# Patient Record
Sex: Male | Born: 1955 | Hispanic: Refuse to answer | Smoking: Never smoker
Health system: Southern US, Community
[De-identification: ages and names within clinical notes are randomized; demographics above are authoritative.]

## PROBLEM LIST (undated history)

## (undated) DIAGNOSIS — Z992 Dependence on renal dialysis: Secondary | ICD-10-CM

## (undated) DIAGNOSIS — I509 Heart failure, unspecified: Secondary | ICD-10-CM

## (undated) DIAGNOSIS — N186 End stage renal disease: Secondary | ICD-10-CM

## (undated) HISTORY — PX: INSERTION OF DIALYSIS CATHETER: SHX1324

## (undated) HISTORY — PX: CARDIAC ELECTROPHYSIOLOGY MAPPING AND ABLATION: SHX1292

---

## 1998-10-23 ENCOUNTER — Emergency Department (HOSPITAL_COMMUNITY): Admission: EM | Admit: 1998-10-23 | Discharge: 1998-10-23 | Payer: Self-pay

## 2002-01-06 ENCOUNTER — Encounter: Admission: RE | Admit: 2002-01-06 | Discharge: 2002-01-06 | Payer: Self-pay | Admitting: Ophthalmology

## 2002-01-06 ENCOUNTER — Encounter: Payer: Self-pay | Admitting: Ophthalmology

## 2002-01-19 ENCOUNTER — Encounter: Admission: RE | Admit: 2002-01-19 | Discharge: 2002-04-19 | Payer: Self-pay | Admitting: Family Medicine

## 2003-02-03 ENCOUNTER — Encounter: Payer: Self-pay | Admitting: Emergency Medicine

## 2003-02-03 ENCOUNTER — Inpatient Hospital Stay (HOSPITAL_COMMUNITY): Admission: EM | Admit: 2003-02-03 | Discharge: 2003-02-04 | Payer: Self-pay | Admitting: Emergency Medicine

## 2003-02-07 ENCOUNTER — Encounter: Payer: Self-pay | Admitting: *Deleted

## 2003-02-07 ENCOUNTER — Ambulatory Visit (HOSPITAL_COMMUNITY): Admission: RE | Admit: 2003-02-07 | Discharge: 2003-02-07 | Payer: Self-pay | Admitting: *Deleted

## 2003-02-20 ENCOUNTER — Encounter: Payer: Self-pay | Admitting: Family Medicine

## 2003-02-20 ENCOUNTER — Ambulatory Visit (HOSPITAL_COMMUNITY): Admission: RE | Admit: 2003-02-20 | Discharge: 2003-02-20 | Payer: Self-pay | Admitting: Family Medicine

## 2003-04-10 ENCOUNTER — Ambulatory Visit (HOSPITAL_COMMUNITY): Admission: RE | Admit: 2003-04-10 | Discharge: 2003-04-10 | Payer: Self-pay | Admitting: Pulmonary Disease

## 2003-04-10 ENCOUNTER — Encounter: Payer: Self-pay | Admitting: Pulmonary Disease

## 2003-08-16 ENCOUNTER — Encounter: Admission: RE | Admit: 2003-08-16 | Discharge: 2003-08-16 | Payer: Self-pay | Admitting: Occupational Medicine

## 2003-08-16 ENCOUNTER — Encounter: Payer: Self-pay | Admitting: Occupational Medicine

## 2004-02-25 ENCOUNTER — Inpatient Hospital Stay (HOSPITAL_COMMUNITY): Admission: AD | Admit: 2004-02-25 | Discharge: 2004-02-28 | Payer: Self-pay | Admitting: Nephrology

## 2004-02-25 ENCOUNTER — Encounter: Payer: Self-pay | Admitting: Emergency Medicine

## 2005-05-08 ENCOUNTER — Ambulatory Visit (HOSPITAL_COMMUNITY): Admission: RE | Admit: 2005-05-08 | Discharge: 2005-05-08 | Payer: Self-pay | Admitting: *Deleted

## 2005-05-12 ENCOUNTER — Ambulatory Visit: Payer: Self-pay | Admitting: Critical Care Medicine

## 2006-02-03 ENCOUNTER — Ambulatory Visit: Payer: Self-pay | Admitting: Critical Care Medicine

## 2006-03-10 ENCOUNTER — Ambulatory Visit: Payer: Self-pay | Admitting: Critical Care Medicine

## 2006-09-07 ENCOUNTER — Ambulatory Visit: Payer: Self-pay | Admitting: Pulmonary Disease

## 2006-11-02 ENCOUNTER — Ambulatory Visit: Payer: Self-pay | Admitting: Critical Care Medicine

## 2006-12-11 ENCOUNTER — Ambulatory Visit: Payer: Self-pay | Admitting: Critical Care Medicine

## 2007-04-26 ENCOUNTER — Ambulatory Visit: Payer: Self-pay | Admitting: Critical Care Medicine

## 2007-04-27 ENCOUNTER — Ambulatory Visit (HOSPITAL_BASED_OUTPATIENT_CLINIC_OR_DEPARTMENT_OTHER): Admission: RE | Admit: 2007-04-27 | Discharge: 2007-04-27 | Payer: Self-pay | Admitting: Critical Care Medicine

## 2007-05-06 ENCOUNTER — Ambulatory Visit: Payer: Self-pay | Admitting: Pulmonary Disease

## 2007-05-26 ENCOUNTER — Ambulatory Visit: Payer: Self-pay | Admitting: Pulmonary Disease

## 2007-05-28 ENCOUNTER — Encounter: Payer: Self-pay | Admitting: Critical Care Medicine

## 2007-05-28 ENCOUNTER — Ambulatory Visit (HOSPITAL_BASED_OUTPATIENT_CLINIC_OR_DEPARTMENT_OTHER): Admission: RE | Admit: 2007-05-28 | Discharge: 2007-05-28 | Payer: Self-pay | Admitting: Pulmonary Disease

## 2007-06-07 ENCOUNTER — Ambulatory Visit: Payer: Self-pay | Admitting: Pulmonary Disease

## 2007-07-05 ENCOUNTER — Ambulatory Visit: Payer: Self-pay | Admitting: Pulmonary Disease

## 2007-08-30 DIAGNOSIS — J45909 Unspecified asthma, uncomplicated: Secondary | ICD-10-CM | POA: Insufficient documentation

## 2007-08-30 DIAGNOSIS — E785 Hyperlipidemia, unspecified: Secondary | ICD-10-CM

## 2007-08-30 DIAGNOSIS — E119 Type 2 diabetes mellitus without complications: Secondary | ICD-10-CM

## 2007-08-30 DIAGNOSIS — I509 Heart failure, unspecified: Secondary | ICD-10-CM | POA: Insufficient documentation

## 2007-08-30 DIAGNOSIS — I10 Essential (primary) hypertension: Secondary | ICD-10-CM | POA: Insufficient documentation

## 2007-08-30 DIAGNOSIS — J449 Chronic obstructive pulmonary disease, unspecified: Secondary | ICD-10-CM

## 2007-08-30 DIAGNOSIS — J309 Allergic rhinitis, unspecified: Secondary | ICD-10-CM | POA: Insufficient documentation

## 2007-12-08 ENCOUNTER — Ambulatory Visit: Payer: Self-pay | Admitting: Pulmonary Disease

## 2007-12-09 ENCOUNTER — Telehealth (INDEPENDENT_AMBULATORY_CARE_PROVIDER_SITE_OTHER): Payer: Self-pay | Admitting: *Deleted

## 2008-01-11 ENCOUNTER — Ambulatory Visit: Payer: Self-pay | Admitting: Critical Care Medicine

## 2008-01-11 DIAGNOSIS — G4730 Sleep apnea, unspecified: Secondary | ICD-10-CM | POA: Insufficient documentation

## 2008-10-18 ENCOUNTER — Ambulatory Visit: Payer: Self-pay | Admitting: Critical Care Medicine

## 2008-10-18 DIAGNOSIS — J018 Other acute sinusitis: Secondary | ICD-10-CM

## 2008-10-18 DIAGNOSIS — J209 Acute bronchitis, unspecified: Secondary | ICD-10-CM

## 2008-11-22 ENCOUNTER — Ambulatory Visit: Payer: Self-pay | Admitting: Critical Care Medicine

## 2009-06-17 ENCOUNTER — Emergency Department (HOSPITAL_COMMUNITY): Admission: EM | Admit: 2009-06-17 | Discharge: 2009-06-18 | Payer: Self-pay | Admitting: Emergency Medicine

## 2011-02-01 LAB — BRAIN NATRIURETIC PEPTIDE: Pro B Natriuretic peptide (BNP): 427 pg/mL — ABNORMAL HIGH (ref 0.0–100.0)

## 2011-02-01 LAB — DIFFERENTIAL
Basophils Absolute: 0.1 10*3/uL (ref 0.0–0.1)
Eosinophils Relative: 2 % (ref 0–5)
Lymphocytes Relative: 12 % (ref 12–46)
Lymphs Abs: 1.4 10*3/uL (ref 0.7–4.0)
Neutro Abs: 9.4 10*3/uL — ABNORMAL HIGH (ref 1.7–7.7)

## 2011-02-01 LAB — POCT I-STAT, CHEM 8
BUN: 86 mg/dL — ABNORMAL HIGH (ref 6–23)
Chloride: 113 mEq/L — ABNORMAL HIGH (ref 96–112)
Creatinine, Ser: 3.5 mg/dL — ABNORMAL HIGH (ref 0.4–1.5)
Potassium: 4.9 mEq/L (ref 3.5–5.1)
Sodium: 142 mEq/L (ref 135–145)
TCO2: 20 mmol/L (ref 0–100)

## 2011-02-01 LAB — URINALYSIS, ROUTINE W REFLEX MICROSCOPIC
Bilirubin Urine: NEGATIVE
Hgb urine dipstick: NEGATIVE
Ketones, ur: NEGATIVE mg/dL
Nitrite: NEGATIVE
Urobilinogen, UA: 0.2 mg/dL (ref 0.0–1.0)
pH: 5.5 (ref 5.0–8.0)

## 2011-02-01 LAB — POCT CARDIAC MARKERS
CKMB, poc: 4.8 ng/mL (ref 1.0–8.0)
Myoglobin, poc: 406 ng/mL (ref 12–200)
Troponin i, poc: <0.05 ng/mL (ref 0.00–0.09)

## 2011-02-01 LAB — URINE MICROSCOPIC-ADD ON

## 2011-02-01 LAB — CBC
HCT: 30 % — ABNORMAL LOW (ref 39.0–52.0)
Platelets: 217 10*3/uL (ref 150–400)
RDW: 14.7 % (ref 11.5–15.5)
WBC: 12.1 10*3/uL — ABNORMAL HIGH (ref 4.0–10.5)

## 2011-03-11 NOTE — Procedures (Signed)
Craig Wilcox, Craig Wilcox                 ACCOUNT NO.:  [id number]   MEDICAL RECORD NO.:  [id number]          PATIENT TYPE:  OUT   LOCATION:  SLEEP CENTER                 FACILITY:  [facility]   PHYSICIAN:  Barbaraann Share, MD,FCCPDATE OF BIRTH:  13-Sep-1956   DATE OF STUDY:  04/27/2007                            NOCTURNAL POLYSOMNOGRAM   REFERRING PHYSICIAN:  Charlcie Cradle. Delford Field, MD, FCCP   INDICATION FOR STUDY:  Hypersomnia with sleep apnea.   EPWORTH SCORE:  8.   SLEEP ARCHITECTURE:  The patient had a total sleep time of 281 minutes  with significantly decreased slow wave sleep as well as REM.  Sleep  onset latency was normal, and REM onset was normal as well.  Sleep  efficiency was very poor at 71%.   RESPIRATORY DATA:  The patient was found to have 112 hypopneas and 37  obstructive apneas for an apnea/hypopnea index of 32 events per hour.  The events were clearly worse in the supine position, and there was  moderate to loud snoring noted throughout.   OXYGEN DATA:  There was O2 desaturation as low as 84% with the patient's  obstructive events.   CARDIAC DATA:  No clinically significant cardiac arrhythmias were noted.   MOVEMENT/PARASOMNIA:  The patient was found to have 29 leg jerks with 3  per hour, resulting in arousal or awakening.  This does not appear to be  clinically significant.   IMPRESSION/RECOMMENDATIONS:  Moderate obstructive sleep apnea/hypopnea  syndrome with an apnea/hypopnea index of 32 events per hour and oxygen  desaturation as low as 84%.  The events were worse in the supine  position.  It should also be noted that the patient had very little slow  wave sleep and rapid eye movement; therefore, his degree of sleep apnea  may be underestimated.  Treatment for this degree of sleep apnea can  include weight loss alone if applicable, upper airway surgery, oral  appliance, and also continuous positive airway pressure.  Clinical  correlation is suggested.     Barbaraann Share, MD,FCCP  Diplomate, American Board of Sleep  Medicine  Electronically Signed    KMC/MEDQ  D:  05/05/2007 17:26:12  T:  05/06/2007 12:16:25  Job:  [id number]

## 2011-03-11 NOTE — Procedures (Signed)
NAMEESEQUIEL, KLEINFELTER                 ACCOUNT NO.:  [id number]   MEDICAL RECORD NO.:  [id number]          PATIENT TYPE:  OUT   LOCATION:  SLEEP CENTER                 FACILITY:  [facility]   PHYSICIAN:  Oretha Milch, MD      DATE OF BIRTH:  03-04-1956   DATE OF STUDY:  05/28/2007                            NOCTURNAL POLYSOMNOGRAM   REFERRING PHYSICIAN:   INDICATION FOR STUDY:  CPAP titration.  Weight 241, neck size 18.5  inches.   EPWORTH SLEEPINESS SCORE:  8   PROCEDURE:  The CPAP titration was performed with a sleep technologist  in attendance.  EEG, ECG, EOG, EMG and respiratory parameters were  recorded.  Sleep stages were scored.  Periodic limb movements and EEG  arousal during sleep were scored according to criteria from the American  Academy of Sleep Medicine.  Data acquisition, collation and scoring have  been validated and clinically correlated.   MEDICATIONS:   SLEEP ARCHITECTURE:  The patient was studied 9:43 p.m. to 5:12 a.m. with  a total time in bed of 449.5 minutes.  Sleep period time was 447 minutes  and the sleep latency of 1 minute and a latency to persistent sleep of 1  minute.  Sleep maintenance efficiency was 81% with a total wake after  sleep onset of 84.5 minutes.  Sleep stage distribution and the  percentage of total sleep time was 9% stage N1, 64% stage N2, 6% stage  N3 and 21% of REM sleep with a REM latency of 89.5 minutes.   AROUSAL DATA:  There were 17 total arousals of which 16 were nonspecific  and one was associated with PLM and zero were associated with  respiratory event for a total arousal index of 2.8 per hour.   RESPIRATORY DATA:  The study was begun with CPAP of +5 cm H2O applied.  While on this level, the patient achieved 151 minutes of sleep with  18.7% of REM sleep and 48.1 minutes in supine sleep.  There was an AHI  of 0.8 at this level.  The respiratory events consisted of zero apneas  and two hypopneas.   The pressure was titrated to +9  cm.  On this level for 88.8 minutes, the  patient achieved 37.3 minutes of non-REM sleep only.  There was an AHI  of 27.3 with 11 central apneas, 4 obstructive apnea, 1 mixed apnea and 1  hypopnea.  The patient was supine at this level for the entire duration.   The optimal CPAP pressure was +8 cm. At this level, the patient achieved  59 minutes of sleep with 32 minutes of REM sleep (64%).  The AHI at this  level was 0 and no events were recorded.   OXYGEN DATA:  During the entire recording, the baseline O2 saturation  was 94% with the lowest O2 saturation of 81% during stage I sleep.  The  desaturation index was 0 with two desaturations below 85% and time spent  below 90% equaling 35 minutes of total recording time and 30 minutes of  total sleep time.   CARDIAC DATA:  The minimum heart rate was 54 during non-REM sleep  and  the maximum heart rate was 92 beats per minute.  Low heart rate during  REM was 68 and high heart rate during REM was 83 beats per minute.   MOVEMENT-PARASOMNIA:  The periodic limb movement index was 0.2 and there  was only one limb movement associated with arousal.   IMPRESSIONS-RECOMMENDATIONS:  Obstructive sleep apnea.  1. CPAP of +8 cm with ResMed Ultra Mirage II wide nasal mask.  2. Maintain lean body weight.  3. Educate patient in sleep hygiene measures.  4. Follow up with referring physician.   DISCUSSION:  Patient seemed to tolerate CPAP well and had a good amount  of supine and REM sleep.  He woke up feeling refreshed after this study.  Note that central apneas emerged on CPAP of +9 cm.  Correlate with  underlying history of congestive heart failure.      Oretha Milch, MD  Electronically Signed     RVA/MEDQ  D:  06/07/2007 13:07:42  T:  06/08/2007 11:42:22  Job:  [id number]

## 2011-03-11 NOTE — Assessment & Plan Note (Signed)
[facility]                             PULMONARY OFFICE NOTE   NAME:[name], [name]                          MRN:          [id number]  DATE:04/26/2007                            DOB:          Sep 03, 1956    Mr. Craig Wilcox returns today in followup.  This is a 55 year old white male,  history of asthmatic bronchitis on a primary basis, recently  hospitalized 2 weeks ago for congestive failure.  He had a previous  history of ejection fraction of 40% with heart catheterization in 2005.  In the interim, the patient has developed increased shortness of breath,  pedal edema and fluid retention.  He is now on Coreg 25 mg b.i.d., Lasix  80 mg daily, Lovastatin 20 mg daily, Glipizide 5 mg daily, Benazepril 40  mg daily and aspirin 81 mg daily.  On this program, he is markedly  better.  He is less short of breath.  He has lost a significant amount  of weight.  His weight was 305 pounds in February.  It is now down to  247 pounds.  His dyspnea is improved.   EXAMINATION:  VITAL SIGNS:  Temp 98, blood pressure 148/88, pulse 71,  saturations 97% room air.  CHEST:  Showed to be clear with prolonged respiratory phase.  No wheeze  or rhonchi were noted.  CARDIAC:  Exam showed regular rate and rhythm without S3, normal S1, S2.  ABDOMEN:  Soft, nontender.  EXTREMITIES:  Showed no edema or clubbing.  SKIN:  Clear.  NEUROLOGIC:  Exam was intact.  HEENT:  Showed no jugular venous distention, no lymphadenopathy,  oropharynx clear.  NECK:  Supple.   IMPRESSION:  This patient is that of hypertension, congestive failure  with LV dysfunction, hypercholesterolemia, diabetes and mild asthma.  Patient's primary problem has been more cardiac lately.  At this point,  we can reduce his Asmanex to off and utilize albuterol as needed only,  and we will see the patient back and return followup in 4 months.  Note:  He has made it a fact that he was described as having nocturnal  desaturation of oxygen and apneas observed in the hospital.   RECOMMENDATION:  That of a formal sleep evaluation.  In this regard, we  will refer him to my partner, Dr. Cyril Mourning, for formal sleep study  and sleep consultation.     Charlcie Cradle Delford Field, MD, [facility]  Electronically Signed    PEW/MedQ  DD: 04/26/2007  DT: 04/26/2007  Job #: [id number]   cc:   [facility]

## 2011-03-11 NOTE — Assessment & Plan Note (Signed)
[facility]                             PULMONARY OFFICE NOTE   NAME:Wilcox, Craig                          MRN:          [id number]  DATE:05/26/2007                            DOB:          September 17, 1956    CONSULTATION REPORT.   Craig Wilcox presents today after a sleep study for evaluation of sleep  apnea.  He reports that his Epworth sleepiness score is 8/24, which  means that he is not a very sleepy person.  However, he does report  witnessed apneas and loud snoring as reported by his kids.  He was  hospitalized at [facility] for a CHF exacerbation and he was told by nurses  there that he did stop breathing in his sleep.   His usual bedtime is around 11 p.m., sleep latency is a few minutes.  He  awakens about once or twice without any post void sleep latency.  Now  that his heart failure symptoms have improved he is able to lay on his  back and sleeps with one pillow.  He gets out of bed around 5 a.m. and  is able to drive to work around 9:56.  He denies excessive caffeine  intake.  He has occasionally been woken up by his own snoring.  There is  no history suggestive of cataplexy, sleep paralysis or parasomnias.  His  weight was 305 pounds in February and is now down to 245.   PAST MEDICAL HISTORY:  1. Congestive heart failure with an EF of 40% documented by      catheterization in 2005.  2. History of asthmatic bronchitis.   PFTs in July, 2006, did not show any airway obstruction with an FEV1/FVC  ratio of 77 but suggested moderate restriction with an FVC of 63%.   CURRENT MEDICATIONS:  1. Benazepril 40 mg daily.  2. Lasix 80 mg daily.  3. Lovastatin 20 mg daily.  4. Aspirin 81 mg daily.  5. Januvia 100 mg daily.  6. Coreg 12.5 mg b.i.d.   SOCIAL HISTORY:  1. He works for a [employer], [employer] in [employer].  2. Lifetime nonsmoker.   PHYSICAL EXAM:  Weight 245 pounds, blood pressure 146/84, heart rate 77,  oxygen saturation 98% on room air.  HEENT:  Normal pharyngeal space.  NECK:  Circumference 17 inches.  CHEST:  Clear to auscultation.  CVS:  S1, S2 normal, 2+ pedal edema.   Overnight polysomnogram showed a total sleep time of 280 minutes with  REM latency of 71 minutes and 38.5 minutes of REM sleep.  Only 16  minutes of slow wave sleep was noted.  The total AHI was 32 per hour  with supine AHI of 63 per hour, suggesting severe obstructive sleep  apnea.  Moderate to loud snoring was noted.  REM-related AHI was 45.2  per hour.  Lowest desaturation was 83% in stage II sleep.  No cardiac  arrhythmias were noted.  Lowest heart rate was 34 per minute.   IMPRESSION:  Severe obstructive sleep apnea, worse in supine and rapid  eye movement sleep.  Note that the sleep  study was done after control of  congestive heart failure and his weight loss from 305 to 247 pounds.  Central apneas were not noted.   RECOMMEND:  I discussed the findings of the sleep study with Craig Wilcox,  the pathophysiology of sleep apnea, its cardiovascular consequences,  especially with heart failure, and modes of treatment including CPAP  were discussed in great detail.  A CPAP titration study will be  scheduled but in the meantime we will start him empirically on C-Flex  Pro +7 cm with a nasal mask and humidifier and a ramp time of 15  minutes.   Common CPAP issues such as dryness of mouth and adjustment problems were  discussed.  He will call us for these problems and we will try to  troubleshoot over the phone as needed.  He will return for a followup  after the CPAP titration study.     Oretha Milch, MD  Electronically Signed    RVA/MedQ  DD: 05/26/2007  DT: 05/27/2007  Job #: [id number]

## 2011-03-14 NOTE — Assessment & Plan Note (Signed)
[facility]                             PULMONARY OFFICE NOTE   NAME:ABERakesh, Dutko                          MRN:          [id number]  DATE:12/11/2006                            DOB:          [birth year]    [name] is a 55 year old white male, history of asthmatic bronchitis,  allergic rhinitis.  The patient overall has improvement in shortness of  breath and cough, maintaining Foradil 1 spray b.i.d., Asmanex 2 sprays  daily, maintains Benicar 40/25 HCT daily for blood pressure control,  Lipitor 20 mg daily.   On exam, temperature 98, blood pressure 164/98, pulse 105, saturation  was 96% on room air.  CHEST:  Showed scattered wheeze with fair air flow.  CARDIAC:  A regular rate and rhythm without S3, normal S1, S2.  ABDOMEN:  Soft, nontender.  EXTREMITIES:  Showed no edema or clubbing.  SKIN:  Clear.  NEUROLOGIC EXAM:  Intact.   IMPRESSION:  That of asthmatic bronchitis with lower airway  inflammation, stable at this time.   PLAN:  For the patient to maintain inhaled medicines as currently dosed  and we will see the patient back in return followup.  He was instructed  to increase his Benicar to 80 mg along with the HCT 25 mg daily until he  can see his primary care physician for further instruction on blood  pressure control.     Charlcie Cradle Delford Field, MD, [facility]  Electronically Signed    PEW/MedQ  DD: 12/14/2006  DT: 12/15/2006  Job #: [id number]   cc:   Dario Guardian, M.D.

## 2011-03-14 NOTE — H&P (Signed)
NAME:  Craig Wilcox, Craig Wilcox                           ACCOUNT NO.:  [id number]   MEDICAL RECORD NO.:  [id number]                   PATIENT TYPE:  INP   LOCATION:  0380                                 FACILITY:  [facility]   PHYSICIAN:  Jonelle Sidle, M.D. [facility]        DATE OF BIRTH:  1956-10-07   DATE OF ADMISSION:  02/03/2003  DATE OF DISCHARGE:                                HISTORY & PHYSICAL   PRIMARY CARE PHYSICIAN:  Meredith Staggers, M.D.   Corinda Gubler CARDIOLOGIST:  Previously, Carole Binning, M.D.   CHIEF COMPLAINT:  Palpitations and chest tightness.   HISTORY OF PRESENT ILLNESS:  Craig Wilcox is a pleasant, 55 year old male, with a  history of hypertension, dyslipidemia, obesity, and type 2 diabetes  mellitus, who has at least a five year history of intermittent palpitations  and previously documented supraventricular tachycardia.  He was evaluated a  few years ago by Carole Binning, M.D. and apparently scheduled for a  Cardiolite and echocardiogram as an outpatient but never followed up for  these studies due to changes in insurance.  He was previously managed with  Corgard and reports an episode of recurrent palpitations that can last up to  several hours at a time, at least once a year.  He has not been on Corgard  for some time.  He presented to the emergency department today after the  onset of rapid palpitations at 7 a.m. that did not subside by 11 a.m.  In  the emergency room, he failed to convert to normal sinus rhythm following an  initial dose of 6 mg adenosine and intravenous Lopressor as well as carotid  sinus massage.  He did eventually convert following a 12 mg dose of  adenosine.  A 12-lead electrocardiogram showed a narrow complex  supraventricular tachycardia with no apparent P waves at a rate of 163 beats  per minute, and postconversion 12-lead electrocardiogram shows normal sinus  rhythm at 91 beats per minute.  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  At present, the patient is without  symptoms.  He did have some chest tightness with rapid heart rate.   ALLERGIES:  No known drug allergies.   CURRENT MEDICATIONS:  1. Altace 5 mg p.o. daily.  2. Previously, Corgard 40-80 mg as directed.  3. Avandamet 11/998, 2 tabs p.o. q.h.s.  4. Aspirin 325 mg p.o. daily.   PAST MEDICAL HISTORY:  1. Type 2 diabetes mellitus.  2. Hypertension.  3. Dyslipidemia.  4. Supraventricular tachycardia, presumably AVNRT or AVRT.  5. No known history of coronary artery disease or myocardial infarction.   SOCIAL HISTORY:  The patient is married and works at [employer].  He lives in  [place] and denies tobacco and alcohol use.  He has not drank significant  amounts of caffeine and denies drug use.   FAMILY HISTORY:  Noncontributory for premature coronary artery disease or  significant arrhythmia.   REVIEW OF SYMPTOMS:  As in history of present illness.  Otherwise negative.   PHYSICAL EXAMINATION:  VITAL SIGNS:  Blood pressure 119/67, heart rate 88  and regular, respirations 18.  Patient is afebrile.  Oxygen saturation is  98% on 2 liters nasal cannula.  GENERAL:  This is an obese male seems to be in no acute distress.  HEENT:  Conjunctivae and lids are normal.  Oropharynx is clear.  NECK:  Supple without jugular venous distension or bruits.  No thyromegaly  is noted.  LUNGS:  Clear to auscultation bilaterally.  Normal respiratory effort at  rest.  CARDIAC:  Regular rate and rhythm without S3 gallop or significant murmur.  ABDOMEN:  Obese without hepatomegaly or bruits.  EXTREMITIES:  No significant clubbing, cyanosis, or edema.  Peripheral  pulses are 2+.  SKIN:  No ulcer changes are noted.   RADIOLOGY:  Chest x-ray is reported as showing no acute cardiopulmonary  disease.   LABORATORY DATA:  Sodium 134, potassium 4.4, BUN 12, creatinine 0.8, glucose  268.  Initial CK 198, CK-MB 7.2, relative index 3.6, troponin I  0.01.  Urine  drug screen is pending.  No CBC is available for review.   IMPRESSION:  1. 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  The patient was previously managed with Corgard.  He has episodes     at least once a year and at this time, had a more prolonged episode with     chest tightness.  Initial troponin I is negative with borderline CK-MB     elevation.  At present, he is asymptomatic following conversion to normal     sinus rhythm with intravenous adenosine.  2. Type 2 diabetes mellitus.  3. Hypertension.  4. Dyslipidemia.   PLAN:  1. Will admit to telemetry for observation and continue to cycle cardiac     markers.  2. Would suggest beta blocker therapy.  The patient will ultimately need 2 D     echocardiogram and Cardiolite to exclude ischemic heart disease.  3. Electrophysiology evaluation if patient has continued symptomatology on     medical therapy.  4. Check TSH.                                               Jonelle Sidle, M.D. [facility]    SGM/MEDQ  D:  02/03/2003  T:  02/03/2003  Job:  [id number]

## 2011-03-14 NOTE — Op Note (Signed)
NAME:  Craig Wilcox, Craig Wilcox                           ACCOUNT NO.:  [id number]   MEDICAL RECORD NO.:  [id number]                   PATIENT TYPE:  INP   LOCATION:  5501                                 FACILITY:  [facility]   PHYSICIAN:  Doylene Canning. Ladona Ridgel, M.D.               DATE OF BIRTH:  26-Jan-1956   DATE OF PROCEDURE:  02/27/2004  DATE OF DISCHARGE:                                 OPERATIVE REPORT   PROCEDURE PERFORMED:  Invasive electrophysiologic study and radio frequency  catheter ablation of atrioventricular node re-entrant tachycardia.   INTRODUCTION:  The patient is a very pleasant 55 year old male with a two  year  history of recurrent tachy palpitations with documented SVT at rates  of 140 to 150 beats per minute.  The patient was admitted to the hospital  with chest tightness and borderline enzyme bump in relationship to his most  recent episode of SVT and underwent catheterization demonstrating  nonobstructive coronary disease.  He  is now referred for ablation.   DESCRIPTION OF PROCEDURE:  After informed consent was obtained, the patient  was taken to the diagnostic electrophysiology laboratory in a fasted state.  After the usual preparation and draping, intravenous fentanyl and Midazolam  was given for sedation.  A 6 French hexapolar catheter was inserted  percutaneously into the right jugular vein and advanced to the coronary  sinus.  A 5 French quadripolar catheter was inserted percutaneously into the  right femoral vein and advanced to the right ventricle.  A 5 French  quadripolar catheter was inserted percutaneously into the right femoral vein  and advanced to the His bundle region.  A 5 French quadripolar catheter was  inserted percutaneously into the right femoral vein and advanced to the  right atrium.  After measurement of the basic intervals, rapid ventricular  pacing was carried out from the right ventricular apex at a pace cycle  length of 600 msec and stepwise decreased  down to 330 msec where VA  Wenckebach was observed.  During rapid ventricular pacing the atrial  activation was midline and decremental.  Next programmed ventricular  stimulation was carried out from the right ventricular apex at a basic drive  cycle length of 191 msec.  The S1-S2 interval was stepwise decreased from  440 msec down to 260 msec where ventricular refractoriness was observed.  During programmed ventricular stimulation, the atrial activation sequence  was midline and decremental.  Next, programmed atrial stimulation was  carried out from the coronary sinus at a pace cycle length of 600 msec.  The  S1-S2 interval was stepwise decreased from  540 msec down to 320 msec where  the AV node effective refractory period was observed.  During programmed  atrial stimulation there were initially no AH jumps or echo beats.  Next,  rapid atrial pacing was carried out from the coronary sinus as well as from  the high right atrium at  pace cycle lengths of 490 msec and stepwise  decreased down to 350 msec where AV Wenckebach was observed.  During rapid  atrial pacing the P-R interval was equal to but not greater than the R-R  interval and there was no inducible SVT.  At this point isoproterenol was  initially infused at between 0.5 and 4 mcg per minute.  Additional  programmed atrial stimulation was then carried out from the high right  atrium as well as the coronary sinus.  On isoproterenol with S1-S2 coupling  interval of 400/260 there was inducible AV node re-entrant tachycardia.  This was characterized by a very short VA time as well as PVCs being placed  at the time of His bundle refractoriness not pre-exciting the atrium.  Ventricular pacing demonstrated a VAV conduction sequence.  All of the above  suggested and confirmed the diagnosis of AV node re-entrant tachycardia.  The ablation catheter was then advanced into the right atrium and a total of  7 radio frequency energy applications  was delivered to the slow pathway  region.  During this time, there was prolonged accelerated junctional  rhythm.  Ablation was made more difficult by the patient's large size,  resulting in excursion of the catheter with respiration.  Several times  there was junctional rhythm with V but no retrograde A and the energy was  discontinued immediately.  There was no evidence after the procedure of any  AV conduction abnormalities.  After the patient was observed for  approximately 45 minutes and during this time, additional rapid atrial and  programmed atrial stimulation was carried out both from the right atrium as  well as the coronary sinus.  There was no inducible SVT.  The catheters were  then removed.  The catheters were removed, hemostasis was assured and the  patient returned to his room in satisfactory condition.   COMPLICATIONS:  There were no immediate procedural complications.   RESULTS:  a.  Baseline electrocardiogram.  The baseline ECG demonstrates  normal sinus rhythm with normal axis and intervals with no pre-excitation  noted.  b.  Baseline intervals.  Sinus node cycle length was 765 msec.  The HV  interval was 65 msec.  The AH interval was 66 msec.  QRS duration was 70  msec.  c.  Rapid ventricular pacing.  Rapid ventricular pacing from the right  ventricular apex demonstrated a VA Wenckebach cycle length of 330 msec.  During rapid ventricular pacing the atrial activation sequence was midline  and decremental.  d.  Programmed ventricular stimulation.  Programmed ventricular stimulation  was carried from the right ventricular apex at basic drive cycle lengths of  161 and 500 msec.  The S1-S2 interval was stepwise decreased down to  ventricular refractoriness at a cycle length of  260 msec.  During  programmed ventricular stimulation the atrial activation was midline and  decremental.  e.  Programmed atrial stimulation.  Programmed atrial stimulation was carried out to  the coronary sinus as well as in the high right atrium at a  basic drive cycle length of 096/045 and 400 msec.  During programmed atrial  stimulation the S1-S2 interval was stepwise decreased down to the AV node  effective refractory period.  During programmed atrial stimulation at an S1-  S2 coupling interval of 400/260 as well as 400/240 there was inducible AV  node re-entrant tachycardia.  1. Rapid atrial pacing.  Rapid atrial pacing was carried out from the high     right atrium as  well as the coronary sinus at basic drive cycle lengths     of 490 msec and stepwise decreased down to 350 msec resulting in the AV     Wenckebach cycle length.  During isoproterenol infusion, rapid atrial     pacing demonstrated an AV Wenckebach cycle length of 270 msec.  During     rapid atrial pacing the P-R interval was equal to the R-R interval but     there was no inducible SVT.     a. Arrhythmias observed.  AV node re-entrant tachycardia initiation was        programmed atrial stimulation on isoproterenol, duration was        sustained, cycle length was 400 msec.  The method of induction was        programmed atrial stimulation.  Method of termination was with rapid        atrial pacing from the coronary sinus as well as from the right        ventricle.     b. Mapping.  Mapping of Koch's triangle demonstrated the usual size and        orientation.  During tachycardia, mapping of the atrial activation        demonstrated the earliest atrial activation in the os of the coronary        sinus in the slow pathway region of Koch's triangle.        a. Radio frequency energy application.  A total of seven radio           frequency energy applications were delivered to the usual slow           pathway region resulting in accelerated junctional rhythm.   CONCLUSION:  This demonstrated successful electrophysiologic study and radio  frequency catheter ablation of typical AV node re-entry tachycardia with a   total of seven RF energy applications delivered to the slow pathway region,  rendering the tachycardia noninducible.                                               Doylene Canning. Ladona Ridgel, M.D.    GWT/MEDQ  D:  02/27/2004  T:  02/27/2004  Job:  [id number]   cc:   Meredith Staggers, M.D.  [address]  [place]  [place] [address]  Fax: [phone]   Carole Binning, M.D. [facility]

## 2011-03-14 NOTE — Assessment & Plan Note (Signed)
[facility]                               PULMONARY OFFICE NOTE   NAME:Wilcox, Craig                          MRN:          [id number]  DATE:09/07/2006                            DOB:          03/09/1956    HISTORY OF PRESENT ILLNESS:  This is a 55 year old white male patient of Dr.  Delford Field, who has a known history of asthmatic bronchitis and allergic  rhinitis, who presents for an acute office visit for productive cough with  thick yellow sputum, fever, chills, and intermittent wheezing.  The patient  is maintained on Asmanex one puff daily.  The patient also has underlying  insulin-dependent diabetes mellitus.  The patient denies any hemoptysis,  chest pain, orthopnea, recent travel of antibiotic use.   PHYSICAL EXAMINATION:  GENERAL:  The patient is a morbidly obese black male  in no acute distress.  VITAL SIGNS:  He is afebrile.  O2 saturation is 97% on room air.  HEENT:  Unremarkable.  NECK:  Supple without adenopathy.  No JVD.  CHEST:  Lung sounds reveal some coarse breath sounds with a few scattered  expiratory wheezes.  CARDIAC:  A regular rate and rhythm.  ABDOMEN:  Morbidly obese and soft.  EXTREMITIES:  Warm without any calf tenderness, cyanosis, clubbing or edema.   IMPRESSION AND PLAN:  Acute asthmatic bronchitic flare.  The patient is to  begin Omnicef x7 days.  Mucinex DM twice daily.  A Xopenex nebulizer  treatment was given in the office.  Will hold on systemic steroids at  present due to patient's insulin-dependent diabetes mellitus.  The patient  is to increase his Asmanex up to 2 puffs daily.  The patient will return  here in 1 week with Dr. Delford Field or sooner if needed.      Rubye Oaks, NP  Electronically Signed      Charlcie Cradle Delford Field, MD, [facility]  Electronically Signed   TP/MedQ  DD: 09/07/2006  DT: 09/07/2006  Job #: [id number]

## 2011-03-14 NOTE — Cardiovascular Report (Signed)
NAME:  Craig Wilcox, STANKE                           ACCOUNT NO.:  [id number]   MEDICAL RECORD NO.:  [id number]                   PATIENT TYPE:  INP   LOCATION:  5501                                 FACILITY:  [facility]   PHYSICIAN:  Charlies Constable, M.D. [facility]              DATE OF BIRTH:  [birth year]   DATE OF PROCEDURE:  02/26/2004  DATE OF DISCHARGE:                              CARDIAC CATHETERIZATION   PROCEDURE:  Cardiac catheterization.   CARDIOLOGIST:  Charlies Constable, M.D.   INDICATIONS:  Mr. Egolf is 55 years old and has had recurrent SVT and has  diabetes.  He was recently admitted to the hospital with SVT and had  associated chest pain.  He was evaluated further with catheterization and  then may consider ablation for his SVT.   DESCRIPTION OF PROCEDURE:  The procedure was performed via the right femoral  artery using an arterial sheath and 6 French preformed coronary catheters.  A front wall arterial puncture was performed, and Omnipaque contrast was  used.  The right femoral artery was closed with AngioSeal at the end of the  procedure.  The patient tolerated the procedure well and left the laboratory  in satisfactory condition.   RESULTS:  The left main coronary artery:  The left main coronary artery was  free of significant disease.   Left anterior descending:  The left anterior descending artery gave rise to  a diagonal branch and two septal perforators.  The LAD became somewhat  smaller in caliber, and there was bridging in the mid portion of the LAD  which narrowed the vessel to 40-50%.  Arc of the flow to the distal vessel  was somewhat slower into the circumflex artery.   Circumflex artery:  The circumflex artery gave rise to a small ramus branch,  marginal branch and a posterior lateral branch.  These vessels were free of  significant disease.   Right coronary artery:  The right coronary is a moderate size vessel that  gave rise to a right ventricular branch, a posterior  descending and two  posterior lateral branches.  There was 30% narrowing in the proximal right  coronary artery and irregularities in the proximal and mid right coronary  artery.   LEFT VENTRICULOGRAPHY:  The left ventriculogram was performed in the RAO  projection and showed questionable mild global hypokinesis and questionable  slight hypokinesis of the apex.  The estimated ejection fraction was 50%.   PRESSURES:  1. The aortic pressure was 154/92 with a mean of 117.  2. The left ventricular pressure was 154/25.   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  RECOMMENDATIONS:  Reassurance.  We will plan to proceed with an ablation  procedure tomorrow for recurrent SVT.                                               Charlies Constable, M.D. [facility]    BB/MEDQ  D:  02/26/2004  T:  02/27/2004  Job:  [id number]   cc:   Meredith Staggers, M.D.  [address]  [place]  [place] [address]  Fax: [phone]   Carole Binning, M.D. [facility]   Doylene Canning. Ladona Ridgel, M.D.

## 2011-03-14 NOTE — H&P (Signed)
NAME:  Craig Wilcox, Craig Wilcox                           ACCOUNT NO.:  [id number]   MEDICAL RECORD NO.:  [id number]                   PATIENT TYPE:  EMS   LOCATION:  ED                                   FACILITY:  [facility]   PHYSICIAN:  Lorain Childes, M.D. [facility]        DATE OF BIRTH:  1956/03/17   DATE OF ADMISSION:  02/25/2004  DATE OF DISCHARGE:                                HISTORY & PHYSICAL   PRIMARY CARE PHYSICIAN:  Dr. Dayton Scrape.   PRIMARY CARDIOLOGIST:  Dr. Gerri Spore.   CHIEF COMPLAINT:  Palpitations.   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  He was admitted with palpitations and recurrent SVT.  The patient has a  known history of SVT and is supposedly taking Corgard daily.  He has been  out of his medications for the past week or so.  This evening around 9:00  p.m. the patient reports palpitations.  He had three Corgard at home and he  took one of them without any relief.  He then came to the emergency room in  the early a.m. due to worsening symptoms.  In the emergency room he was  given adenosine 6 mg and 12 mg which did not break the SVT.  He was started  on a diltiazem drip with improvement of his heart rate.  Transiently he  converted to a sinus rhythm and then went back into the SVT at a rate of  about 120.  Cardiac enzymes were checked.  His MB was mildly elevated and  ________ admission.  The patient reports mild chest pain with his SVT and  mild shortness of breath.  He denies any syncope.  He denies any fevers or  chills.  No alcohol intake.  He is taking no new over-the-counter  medications.   PAST MEDICAL HISTORY:  1. 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  He is status     post exercise thallium on 01/2003 which revealed an EF of 41% with mild     to moderate apical and septal hypokinesis.  He achieved a target heart     rate of 144  reaching 85% of predicted heart rate.  He had no ischemia.     Positive .  2. Diabetes type 2 on oral medications.  3. Hypertension.  4. Hyperlipidemia.   ALLERGIES:  ERYTHROMYCIN which causes eye swelling and rash.   MEDICATIONS:  He ran out of all meds approximately a week ago.  He is  supposed to take; Altace 5 mg daily, Corgard 40 mg daily, Avandamet 2.02/999  mg one tablet b.i.d., aspirin 325 mg daily, Lipitor he has not taken this  for several months due to cost, nitroglycerin p.r.n. and Tylenol p.r.n.   SOCIAL HISTORY:  He lives in [place] with his wife.  He is currently  unemployed.  He denies any tobacco, no alcohol, no other drugs, no herbal  medication use.  He does not do any regular exercise or follow any  particular diet.   FAMILY HISTORY:  His father secondary to lymphoma and diabetes.  The mom  died with diabetes and a stroke.  He has one brother who is healthy.  Three  sisters, one sister who has diabetes and the others are healthy.   REVIEW OF SYSTEMS:  He denies any fevers or chills.  No headache.  No visual  changes.  No weight loss.  No adenopathy.  No skin rashes or lesions.  CARDIOPULMONARY: Per HPI and also denies any dyspnea on exertion.  No  orthopnea.  No PND.  No lower extremity edema.  He does have palpitations.  He denied syncope.  No cough.  No wheezing.  He does report seasonal  allergies.  No urinary symptoms.  No myalgias or arthralgias.  No blood in  his stool.  No significant GERD.  He does have polyuria and polydipsia.  All  other systems are negative.   PHYSICAL EXAMINATION:  VITAL SIGNS: Temperature is 97, pulse 126,  respiration 20, blood pressure is 114/64, he is sating at 96% on room air.  GENERAL: He is a pleasant, obese gentleman, comfortable, in no acute  distress.  HEENT: Normocephalic, atraumatic.  Pupils are round and reactive to light.  Oropharynx is clear.  NECK: Supple.  JVP is approximately 7 cm.  No bruits.  CARDIOVASCULAR:  Normal S1.  Split S2.  Regular rhythm.  Tachycardic.  PMI is  nondisplaced.  Pulses are equal and 2+ without bruits.  LUNGS: Clear bilaterally.  ABDOMEN: Soft with positive bowel sounds.  Obese, nontender.  EXTREMITIES: No edema.  NEURO: Nonfocal.   EKG: He had a transient sinus rhythm when his EKG was obtained from [facility].  His rate is 71 in sinus rhythm, normal intervals, no ischemic  changes.  Repeat EKG and his SVT is pending.   LABORATORY DATA:  Reveals a white count of 13.1, platelet count 235,  hematocrit of 44.7, his creatinine is 1.1, sodium 129, glucose of 499,  potassium 4.6.  GI panel is normal.  CK-MB is 5.6.  Troponin is 0.06.   ASSESSMENT/PLAN:  The patient is a 55 year old gentleman with known history  of SVT, admitted with SVT with a mildly elevated CK-MB, and managed with a  diltiazem drip.   PROBLEM:  1. SVT.  The patient has previously been managed with beta blocker     (Labetalol).  I will admit him on telemetry, start metoprolol and titrate     this as tolerated.  Will continue his diltiazem drip and wean it as     tolerated.  The etiology suspected is recurrence of his AVNRT/AVRT.  I am     cycling cardiac enzymes to assess for ischemia as a trigger and also will     check his thyroid panel.  As his cardiac enzymes are midy elevated will     give him aspirin, Lovenox and consider a repeat stress test for cardiac     catheterization on Monday pending the trend of his cardiac enzymes.  2. Diabetes.  It has been poorly controlled.  Will check a hemoglobin A1c.     Will cover with sliding scale for now to determine his overall need.  He     likely will need standing dose of insulin in addition to p.o. meds.  He     has  been off of his meds for the past week or so which is likely the     cause of his extreme hyperglycemia currently.  3. Hyperlipidemia.  Will check a fasting panel.  Will restart his    atorvastatin.  We will discuss a less expensive  alternative and a     possible Child psychotherapist consult.  4. Hypertension.  Controlled with the above meds.  5. Hyponatremia.  His sodium is 129 and this is low due to his elevated     glucose.  His corrected sodium is 136.  Will manage his glucose as     described above.                                                Lorain Childes, M.D. [facility]    CGF/MEDQ  D:  02/25/2004  T:  02/25/2004  Job:  [id number]

## 2011-03-14 NOTE — Consult Note (Signed)
NAME:  Craig Wilcox, Craig Wilcox                           ACCOUNT NO.:  [id number]   MEDICAL RECORD NO.:  [id number]                   PATIENT TYPE:  INP   LOCATION:  5501                                 FACILITY:  [facility]   PHYSICIAN:  Doylene Canning. Ladona Ridgel, M.D.               DATE OF BIRTH:  1956-07-11   DATE OF CONSULTATION:  02/26/2004  DATE OF DISCHARGE:                                   CONSULTATION   REFERRING PHYSICIANS:  1. Carole Binning, M.D.  2. Rollene Rotunda, M.D.   REASON FOR CONSULTATION:  The consultation is requested for evaluation of  SVT associated with chest pain and borderline positive cardiac enzymes.   HISTORY OF PRESENT ILLNESS:  The patient is a 54 year old man with multiple  cardiac risk factors, who is admitted to the hospital with recurrent  tachypalpitations and documented SVT.  This lasted for several hours.  He  took his Corgard at home, which offered him on relief and he subsequently  presented to the emergency room.  Adenosine initially did not break his SVT.  He was started on diltiazem and his tachycardia terminated.  He was  subsequently found to have borderline cardiac enzymes, including elevated  troponins and is admitted for additional evaluation and treatment.  He  denies shortness of breath, syncope, or near syncope associated with his  palpitations.  He states that these episodes have occurred once or twice a  year for the last several years.  At times they will stop on their own after  he takes an additional Corgard and goes and lies down.  At other times, the  episodes will last six or eight or more hours.   PAST MEDICAL HISTORY:  The patient's additional past medical history is  notable for:  1. Diabetes which has been diagnosed for now nearly 10 years.  2. Hypertension.  3. Hyperlipidemia.   He underwent Cardiolite stress testing back in April of 2004 after an  additional episode of SVT and this demonstrated hypokinesis in the apex and  septum  with an ejection fraction of 14%.  At that time, there was no  ischemia, but evidence of a scar.   SOCIAL HISTORY:  [place]  He denies  tobacco or ethanol abuse.   FAMILY HISTORY:  Notable for his mother dying of complications of diabetes  and his father dying of lymphoma.  He has one brother and three sisters.  One of his sisters has diabetes.   REVIEW OF SYSTEMS:  Notable for no fevers, chills, or night sweats.  He  denies headache or vision problems.  He does have occasional upper  respiratory-type symptoms with nasal discharge.  He denies any skin changes  or skin lesions.  He denies chest pain, except with his palpitations and  describes this more as a chest pressure sensation rather than pain.  He has  no significant  shortness of breath.  He denies dyspnea, orthopnea, PND, or  syncope.  He denies cough or wheezing.  He denies hematuria, dysuria, or  urinary frequency.  He denies weakness, numbness, depression, or anxiety  problems.  He denies arthralgias or arthritis.  He denies nausea, vomiting,  diarrhea, or constipation.  The rest of his review of systems was negative.   PHYSICAL EXAMINATION:  GENERAL APPEARANCE:  He is a pleasant, obese, well-  appearing, middle-aged man in no distress.  VITAL SIGNS:  The blood pressure was 132/79, the pulse was 82 and regular,  the respirations were 18, and the temperature was 97 degrees.  HEENT:  Normocephalic and atraumatic.  The pupils were equal and round.  The  oropharynx was moist.  The sclerae were anicteric.  NECK:  No jugular venous distention.  There was no thyromegaly.  The trachea  was midline.  LUNGS:  Clear bilaterally to auscultation.  There were no wheezing, rhonchi,  or rales.  CARDIOVASCULAR:  Regular rate and rhythm with normal S1 and S2.  ABDOMEN:  Soft, nontender, and nondistended.  There was no organomegaly.  It  was obese.  EXTREMITIES:  No cyanosis, clubbing, or edema.  The  pulses were 2+ and  symmetric.   LABORATORY DATA:  The EKG demonstrates normal sinus rhythm with normal axis  and intervals and no ventricular pre-excitation noted.  EKG of his SVT  demonstrates a narrow QRS tachycardia.  There are no clearly discernable P  waves during the tracings.  The rate of the tachycardia varies between 143  and 122.  I suspect this was related to the Cardizem with slowing of the  rate.   IMPRESSION:  1. Recurrent supraventricular tachycardia associated with chest pressure and     only intermittently terminating with adenosine.  2. Borderline troponins associated with chest pain with multiple cardiac     risk factors.  3. Diabetes.   RECOMMENDATIONS:  I agree with plans to proceed with left heart  catheterization as he has multiple risk factors and borderline troponins in  the context of his SVT which might suggest underlying coronary  atherosclerosis, which demonstrated prior scar.  If coronary intervention is  required, then we would schedule him for catheter ablation and  electrophysiologic testing as an outpatient after several weeks.  However,  no percutaneous intervention or other interventions are required and  electrophysiologic study and catheter would be warranted and this would be  warranted and this would be scheduled as soon as possible.  I discussed the  risks, benefits, goals, and expectations of electrophysiologic testing and  catheter ablation with the patient and he would like to proceed.                                               Doylene Canning. Ladona Ridgel, M.D.    GWT/MEDQ  D:  02/26/2004  T:  02/26/2004  Job:  [id number]   cc:   Meredith Staggers, M.D.  [address]  [place]  [place] [address]  Fax: [phone]   Carole Binning, M.D. [facility]

## 2011-03-14 NOTE — Discharge Summary (Signed)
NAME:  ADITHYA, DIFRANCESCO                           ACCOUNT NO.:  [id number]   MEDICAL RECORD NO.:  [id number]                   PATIENT TYPE:  INP   LOCATION:  5501                                 FACILITY:  [facility]   PHYSICIAN:  Guy Franco, P.A. [facility]                DATE OF BIRTH:  [birth year]   DATE OF ADMISSION:  02/25/2004  DATE OF DISCHARGE:  02/28/2004                           DISCHARGE SUMMARY - REFERRING   DISCHARGE DIAGNOSES:  1. Supraventricular tachycardia, status post ablation, currently in normal     sinus rhythm.  2. Hypertension, treated.  3. Hypertriglyceridemia, resume Lipitor.  4. Diabetes mellitus, oral agent.   HOSPITAL COURSE:  Mr. Connors is a 55 year old male patient with a history of  SVT who ran out of his medications approximately one week prior to  admission.  Around the evening of admission he developed palpitations and  found three of his Corgard tablets at home and took one of them without  relief.  He then presented to [facility] Emergency Room.  He was given 6 mg  of Adenocard, which was then followed by 12 mg of Adenocard which did not  break the SVT.  He was started on a Cardizem drip with improvement of his  heart rate.  He then transiently converted into sinus rhythm and then went  back into SVT.   Ultimately, he underwent an EP consult and underwent subsequently ablation  and was discharged to home in normal sinus rhythm.   The patient was also seen to have a mild increase in his enzymes with a  maximum CK of 309, however, the MB fraction was low at 4.6.  He did undergo  cardiac catheterization which revealed a proximal 30% right lesion and LAD  had a mid 40% lesion with no angiographic evidence of restricted flow in the  circumflex.  EF was calculated at 50% with slight hypokinesis of the apex.   The patient was felt to be ready for discharge to home on Feb 28, 2004 in  stable condition.  Blood pressure is 153/93.  Pulse 86. Respirations 20.  At  this  point we have decided to resume his Nadolol as well as Altace.  For his  diabetes we will go ahead and resume his Avandamet.   DISCHARGE MEDICATIONS:  1. Amaryl 4 mg daily.  2. Nadolol 40 mg daily.  3. Altace 5 mg daily.  4. Lipitor 20 mg daily.  5. Avandamet 2.5/1,000 one tablet b.i.d.  6. Enteric coated aspirin 325 mg one daily.  7. Tylenol as needed for pain, one to two tablets q.6h. p.r.n.   The patient will need antibiotics before any urinary, dental or bowel  procedures for the next six months only.   He is instructed not to strain or lift any heavy objects over 10 pounds for  one week.  He is to remain on a low-fat diabetic diet.  Clean  puncture with  soap and water.  Call for questions or concerns.  He is to make a followup  appointment with his primary care physician for management of his diabetes.  In addition, he has a followup appointment with Dr. Daisey Must on April 01, 2004 at 3:45 p.m.                                                Guy Franco, P.A. [facility]    LB/MEDQ  D:  02/28/2004  T:  02/28/2004  Job:  [id number]   cc:   Meredith Staggers, M.D.  [address]  [place]  [place] [address]  Fax: [phone]   Carole Binning, M.D. [facility]

## 2011-03-14 NOTE — Assessment & Plan Note (Signed)
[facility]                             PULMONARY OFFICE NOTE   NAME:Craig Wilcox, Craig Wilcox                          MRN:          [id number]  DATE:11/02/2006                            DOB:          1956/08/07    Craig Wilcox is a 55 year old white male with a history of asthmatic  bronchitis, allergic rhinitis, who comes in today with increased  wheezing, chest tightness, coughing up thick yellow mucus.  He has also  been out of his Benicar for several weeks.   1. He is supposed to be on Benicar hydrochlorothiazide 40/25 one      daily.  2. Also on the Asmanex 2 sprays daily.  3. Lipitor 20 mg daily.  4. Humulin 75/35 at 25 units b.i.d.   EXAM:  Temp was 98, blood pressure 186/100, pulse 100, saturation 95% on  room air.  CHEST:  Inspiratory and expiratory wheeze.  Poor air flow.  CARDIAC:  Regular rate and rhythm without S3.  Normal S1, S2.  ABDOMEN:  Soft, nontender.  EXTREMITIES:  No edema or clubbing.  SKIN:  Clear.   IMPRESSION:  Asthmatic bronchitic flare with hypertension poorly  controlled due to non-adherence to medications.   PLAN:  Resume the Benicar hydrochlorothiazide 40/25 daily.  The patient  to increase Asmanex 2 sprays daily, begin Foradil 1 spray b.i.d.  Re-  pulse prednisone at 40 mg and taper down by 10 mg every 2 days until  off.  He does not require antibiotics.  Plan is to return this patient  in followup in 2 months.     Charlcie Cradle Delford Field, MD, [facility]  Electronically Signed    PEW/MedQ  DD: 11/02/2006  DT: 11/02/2006  Job #: [id number]   cc:   Dario Guardian, M.D.

## 2011-03-14 NOTE — Discharge Summary (Signed)
NAME:  Craig Wilcox, Craig Wilcox                           ACCOUNT NO.:  [id number]   MEDICAL RECORD NO.:  [id number]                   PATIENT TYPE:  INP   LOCATION:  0380                                 FACILITY:  [facility]   PHYSICIAN:  Jesse Sans. Wall, M.D. [facility]            DATE OF BIRTH:  12/12/1955   DATE OF ADMISSION:  02/03/2003  DATE OF DISCHARGE:  02/04/2003                           DISCHARGE SUMMARY - REFERRING   BRIEF HISTORY:  The patient is a 55 year old male with a history of  hypertension, dyslipidemia, obesity, type 2 diabetes mellitus, as well as  intermittent palpitations and previous history of documented  supraventricular tachycardia.  He was evaluated a few years ago by Carole Binning, M.D. [facility] and apparently was scheduled for a Cardiolite and  echocardiogram as an outpatient, but never followed up for these studies due  to a change in his insurance.  He was previously managed on Corgard and at  time of admission he reported that he had episodes of recurrent palpitations  that lasted up to several hours at a time at least once per year.  He has  not been on Corgard for some time.   The patient presented to the emergency room at [facility] on February 03, 2003 after the onset of palpitations that did not subside after several  hours.  The patient was found to be in SVT.  He did not convert to normal  sinus rhythm after an initial dose of 6 mg of Adenosine and intravenous  Lopressor as well as carotid massage.  He eventually converted following 12  mg of IV Adenosine.  A 12-lead EKG showed narrow complex supraventricular  tachycardia with no apparent P-waves at a rate of 163 beats per minute.  A  post conversion 12-lead EKG showed normal sinus rhythm, rate 91 beats per  minute.  It was felt that the patient had maybe nodal reentrant tachycardia.  He was admitted for further observation.  The patient did have some chest  tightness associated with rapid heart rate.   PAST MEDICAL HISTORY:  As noted.  The patient had a history of diabetes  mellitus, hypertension, dyslipidemia, previous supraventricular tachycardia,  but no known history of coronary artery disease.   ALLERGIES:  No known drug allergies.   MEDICATIONS PRIOR TO ADMISSION:  1. Altace 5 mg daily.  2. He had previously been on Corgard 40-80 mg as directed; however, he had     not been taking this recently.  3. He was also on Avandamet 11/998 three tablets at bedtime.  4. Aspirin 325 mg daily.  5. Lipitor dosage not available.   SOCIAL HISTORY:  The patient is married.  He works at [employer].  He lives in  [place].  He denies tobacco or alcohol use.  He does not take in  significant amounts of caffeine.  He denies drug use.   FAMILY HISTORY:  Noncontributory for premature coronary artery disease or  significant arrhythmia.   HOSPITAL COURSE:  As noted, this patient was admitted to [facility] after he presented to the emergency room with supraventricular  tachycardia that was eventually converted to sinus rhythm.  He was admitted  for further observation.  The patient stayed overnight and had no further  episodes of SVT and no further episodes of chest tightness.  Arrangements  were made to discharge him on February 04, 2003 in improved and stable  condition with plans for an outpatient echocardiogram and an outpatient  Cardiolite.  He was restarted on his Corgard and was discharged on this  medication.   LABORATORY DATA:  A chemistry profile revealed BUN 21, creatinine 0.8,  potassium 4.4, glucose 268, sodium 134.  CK-MBs were mildly elevated,  although troponins were negative.  CK was 198.  MB was 7.2.  Relative index  3.6.  Troponin I was 0.01.  Repeat revealed CK 126, MB 5.1, index 4.0,  troponin 0.04.  Final repeat revealed CK 104, MB 3.9, index 3.8, troponin I  0.02.  A lipid profile revealed cholesterol 160, triglycerides 157, HDL 43,  LDL 86.  A TSH was 1.599.  A  repeat TSH was 0.944.  A drug screen was  negative.  An EKG showed normal sinus rhythm, rate 91 beats per minute with  nonspecific ST changes.   DISCHARGE MEDICATIONS:  1. Altace 5 mg daily.  2. Corgard 40 mg daily.  3. Avandamet 11/998 two at bedtime.  4. Aspirin 325 mg daily.  5. Lipitor as previously taken.  6. Nitroglycerin p.r.n. for chest pain.  7. Tylenol as needed for pain.   The patient was told to avoid any strenuous activity until further  evaluated.  He was to be on a low salt, low fat diet.  He was to have an  outpatient echocardiogram as well as an outpatient exercise Cardiolite to be  arranged through the [place] office.  He would also follow up with the  [place] office.  They would call him for an appointment.  He would see  Meredith Staggers, M.D. as needed or as scheduled.   PROBLEM LIST AT TIME OF DISCHARGE:  1. Supraventricular tachycardia, recurrent, converted to sinus rhythm     following 12 mg of Adenosine in the emergency room.  2. Previous history of supraventricular tachycardia.  3. Hypertension.  4. History of elevated lipids.  5. Diabetes mellitus.  6. Mildly elevated CK-MB enzymes with negative troponins.  7. Plans for outpatient echocardiogram and Cardiolite.     Delton See, P.A. [facility]                  Thomas C. Daleen Squibb, M.D. [facility]    DR/MEDQ  D:  02/08/2003  T:  02/08/2003  Job:  [id number]   cc:   Thomas C. Wall, M.D. [facility]   Meredith Staggers, M.D.  [address]  [place]  [place] [address]  Fax: [phone]

## 2012-10-28 ENCOUNTER — Emergency Department (HOSPITAL_COMMUNITY): Payer: BC Managed Care – PPO

## 2012-10-28 ENCOUNTER — Emergency Department (HOSPITAL_COMMUNITY)
Admission: EM | Admit: 2012-10-28 | Discharge: 2012-10-28 | Disposition: A | Discharge status: Home / Self Care | Payer: BC Managed Care – PPO | Attending: Emergency Medicine | Admitting: Emergency Medicine

## 2012-10-28 ENCOUNTER — Encounter (HOSPITAL_COMMUNITY): Payer: Self-pay | Admitting: *Deleted

## 2012-10-28 DIAGNOSIS — N19 Unspecified kidney failure: Secondary | ICD-10-CM | POA: Insufficient documentation

## 2012-10-28 DIAGNOSIS — Z992 Dependence on renal dialysis: Secondary | ICD-10-CM | POA: Insufficient documentation

## 2012-10-28 DIAGNOSIS — N186 End stage renal disease: Secondary | ICD-10-CM | POA: Insufficient documentation

## 2012-10-28 DIAGNOSIS — Z79899 Other long term (current) drug therapy: Secondary | ICD-10-CM | POA: Insufficient documentation

## 2012-10-28 DIAGNOSIS — I509 Heart failure, unspecified: Secondary | ICD-10-CM | POA: Insufficient documentation

## 2012-10-28 HISTORY — DX: Dependence on renal dialysis: N18.6

## 2012-10-28 HISTORY — DX: Dependence on renal dialysis: Z99.2

## 2012-10-28 HISTORY — DX: Heart failure, unspecified: I50.9

## 2012-10-28 LAB — BLOOD GAS, ARTERIAL
Acid-base deficit: 7.8 mmol/L — ABNORMAL HIGH (ref 0.0–2.0)
Drawn by: 331471
O2 Saturation: 97.3 %
pCO2 arterial: 35.9 mmHg (ref 35.0–45.0)
pO2, Arterial: 96 mmHg (ref 80.0–100.0)

## 2012-10-28 LAB — PRO B NATRIURETIC PEPTIDE: Pro B Natriuretic peptide (BNP): 43330 pg/mL — ABNORMAL HIGH (ref 0–125)

## 2012-10-28 LAB — BASIC METABOLIC PANEL
CO2: 18 mEq/L — ABNORMAL LOW (ref 19–32)
Chloride: 95 mEq/L — ABNORMAL LOW (ref 96–112)
Creatinine, Ser: 21.77 mg/dL — ABNORMAL HIGH (ref 0.50–1.35)
GFR calc Af Amer: 2 mL/min — ABNORMAL LOW (ref >90)
Sodium: 138 mEq/L (ref 135–145)

## 2012-10-28 LAB — CBC
MCV: 95.8 fL (ref 78.0–100.0)
Platelets: 192 10*3/uL (ref 150–400)
RBC: 2.88 MIL/uL — ABNORMAL LOW (ref 4.22–5.81)
RDW: 14.6 % (ref 11.5–15.5)
WBC: 12.1 10*3/uL — ABNORMAL HIGH (ref 4.0–10.5)

## 2012-10-28 MED ORDER — FUROSEMIDE 10 MG/ML IJ SOLN
40.0000 mg | Freq: Once | INTRAMUSCULAR | Status: DC
  Filled 2012-10-28: qty 4

## 2012-10-28 MED ORDER — SODIUM POLYSTYRENE SULFONATE 15 GM/60ML PO SUSP
45.0000 g | Freq: Once | ORAL | Status: AC
  Administered 2012-10-28: 45 g via ORAL
  Filled 2012-10-28: qty 180

## 2012-10-28 NOTE — Progress Notes (Signed)
Pt reports no pcp but sees new nephrologist Dr Susa Loffler Epic updated

## 2012-10-28 NOTE — ED Notes (Signed)
Pt reports shob and lightheadedness since approx 6am today. Peritoneal dialysis pt. Reports he feels as though he took in too much fluid yesterday. Usually does 4 exchanges at home, has done one this am. Continued to have shob. Sts same thing happened around Christmas and was put on the "cycle machine" to help rid the extra fluid.

## 2012-10-28 NOTE — ED Provider Notes (Signed)
History     CSN: [id number]  Arrival date & time 10/28/12  1117   First MD Initiated Contact with Patient 10/28/12 1153      Chief Complaint  Patient presents with  . Shortness of Breath  . Dialysis Patient     (Consider location/radiation/quality/duration/timing/severity/associated sxs/prior treatment) HPI Pt states he feels that he drank too much fluids yesterday while celebrating the New year yesterday and today feels mildly short of breath.  He denies fever/chills, no abdominal pain.  He states he has done his PD at home twice today (fluid is dwelling right now)- he states he usually does the dialysis approx every 4 hours.  Is normally followed in [place] by renal and has been seen in the past at [facility].  He did not contact them today.  He also has hx of CHF- does not take diuretic.  No leg swelling.  No chest pain or palpitations. There are no other associated systemic symptoms, there are no other alleviating or modifying factors.   Past Medical History  Diagnosis Date  . Chronic kidney disease, stage V requiring chronic dialysis   . CHF (congestive heart failure)     Past Surgical History  Procedure Date  . Insertion of dialysis catheter     peritoneal  . Cardiac electrophysiology mapping and ablation     No family history on file.  History  Substance Use Topics  . Smoking status: Never Smoker   . Smokeless tobacco: Not on file  . Alcohol Use: No      Review of Systems ROS reviewed and all otherwise negative except for mentioned in HPI  Allergies  Erythromycin  Home Medications   Current Outpatient Rx  Name  Route  Sig  Dispense  Refill  . CARVEDILOL 25 MG PO TABS   Oral   Take 50 mg by mouth 2 (two) times daily with a meal.         . HYDRALAZINE HCL 25 MG PO TABS   Oral   Take 25 mg by mouth daily as needed.           BP 138/82  Pulse 101  Temp 97.7 F (36.5 C) (Oral)  Resp 14  SpO2 98% Vitals reviewed Physical Exam Physical  Examination: General appearance - alert, well appearing, and in no distress Mental status - alert, oriented to person, place, and time Eyes - no scleral icterus, no conjunctival injection Mouth - mucous membranes moist, pharynx normal without lesions Chest - clear to auscultation, no wheezes, rales or rhonchi, symmetric air entry, no increased respiratory effort Heart - normal rate, regular rhythm, normal S1, S2, no murmurs, rubs, clicks or gallops Abdomen - soft, nontender, nondistended, no masses or organomegaly Extremities - peripheral pulses normal, no pedal edema, no clubbing or cyanosis Skin - normal coloration and turgor, no rashes  ED Course  Procedures (including critical care time)   Date: 10/28/2012  Rate: 106  Rhythm: sinus tachycardia  QRS Axis: normal  Intervals: QT prolonged  ST/T Wave abnormalities: nonspecific ST/T changes  Conduction Disutrbances:none  Narrative Interpretation:   Old EKG Reviewed: no significant changes except rate increased compared to prior ekg of 06/17/09   3:06 PM  D/w Renal physician on call.  He will see patient in the ED.  He thinks he may be able to be managed as an outpatient but will evaluate the patient in ED.  If he needs to be admitted will need to be admitted to [facility] to hospitalist  service.     4:21 PM Renal has seen patient, has d/w his renal physician in [place]- they state they know that he is under dialyzed and that they are considering doing overnight PD.  They will see him tomorrow.  He is to do 4, 4.25L dialysis sessions today.   Labs Reviewed  CBC - Abnormal; Notable for the following:    WBC 12.1 (*)     RBC 2.88 (*)     Hemoglobin 9.0 (*)     HCT 27.6 (*)     All other components within normal limits  BASIC METABOLIC PANEL - Abnormal; Notable for the following:    Potassium 5.2 (*)     Chloride 95 (*)     CO2 18 (*)     Glucose, Bld 285 (*)     BUN 110 (*)     Creatinine, Ser 21.77 (*)     Calcium 7.0 (*)      GFR calc non Af Amer 2 (*)     GFR calc Af Amer 2 (*)     All other components within normal limits  BLOOD GAS, ARTERIAL - Abnormal; Notable for the following:    pH, Arterial 7.305 (*)     Bicarbonate 17.3 (*)     Acid-base deficit 7.8 (*)     All other components within normal limits  PRO B NATRIURETIC PEPTIDE - Abnormal; Notable for the following:    Pro B Natriuretic peptide (BNP) 43330.0 (*)     All other components within normal limits   Dg Chest 2 View  10/28/2012  *RADIOLOGY REPORT*  Clinical Data: Shortness of breath, CHF  CHEST - 2 VIEW  Comparison: 06/17/2009  Findings: Cardiomegaly with pulmonary vascular congestion and suspected mild interstitial edema. No pleural effusion or pneumothorax.  Visualized osseous structures are within normal limits.  IMPRESSION: Cardiomegaly with pulmonary vascular congestion and suspected mild interstitial edema.   Original Report Authenticated By: Charline Bills, M.D.      1. Renal failure       MDM  Pt on peritoneal dialysis and states that he feels he has fluid overload and is short of breath.  He feels improved while lying still in the ED.  CXR shows mild edema, BUN and creatinine are both elevated.  Potassium is mildly elevated.  D/w Renal Dr. Arta Silence, he will see patient in the ED for evaluation.          Ethelda Chick, MD 10/28/12 [phone]

## 2013-10-11 IMAGING — CR DG CHEST 2V
2 series · 2 of 2 positions shown · non-contrast
Comparison: 06/17/2009

CLINICAL DATA: Shortness of breath, CHF

CHEST - 2 VIEW

[w chest pa]
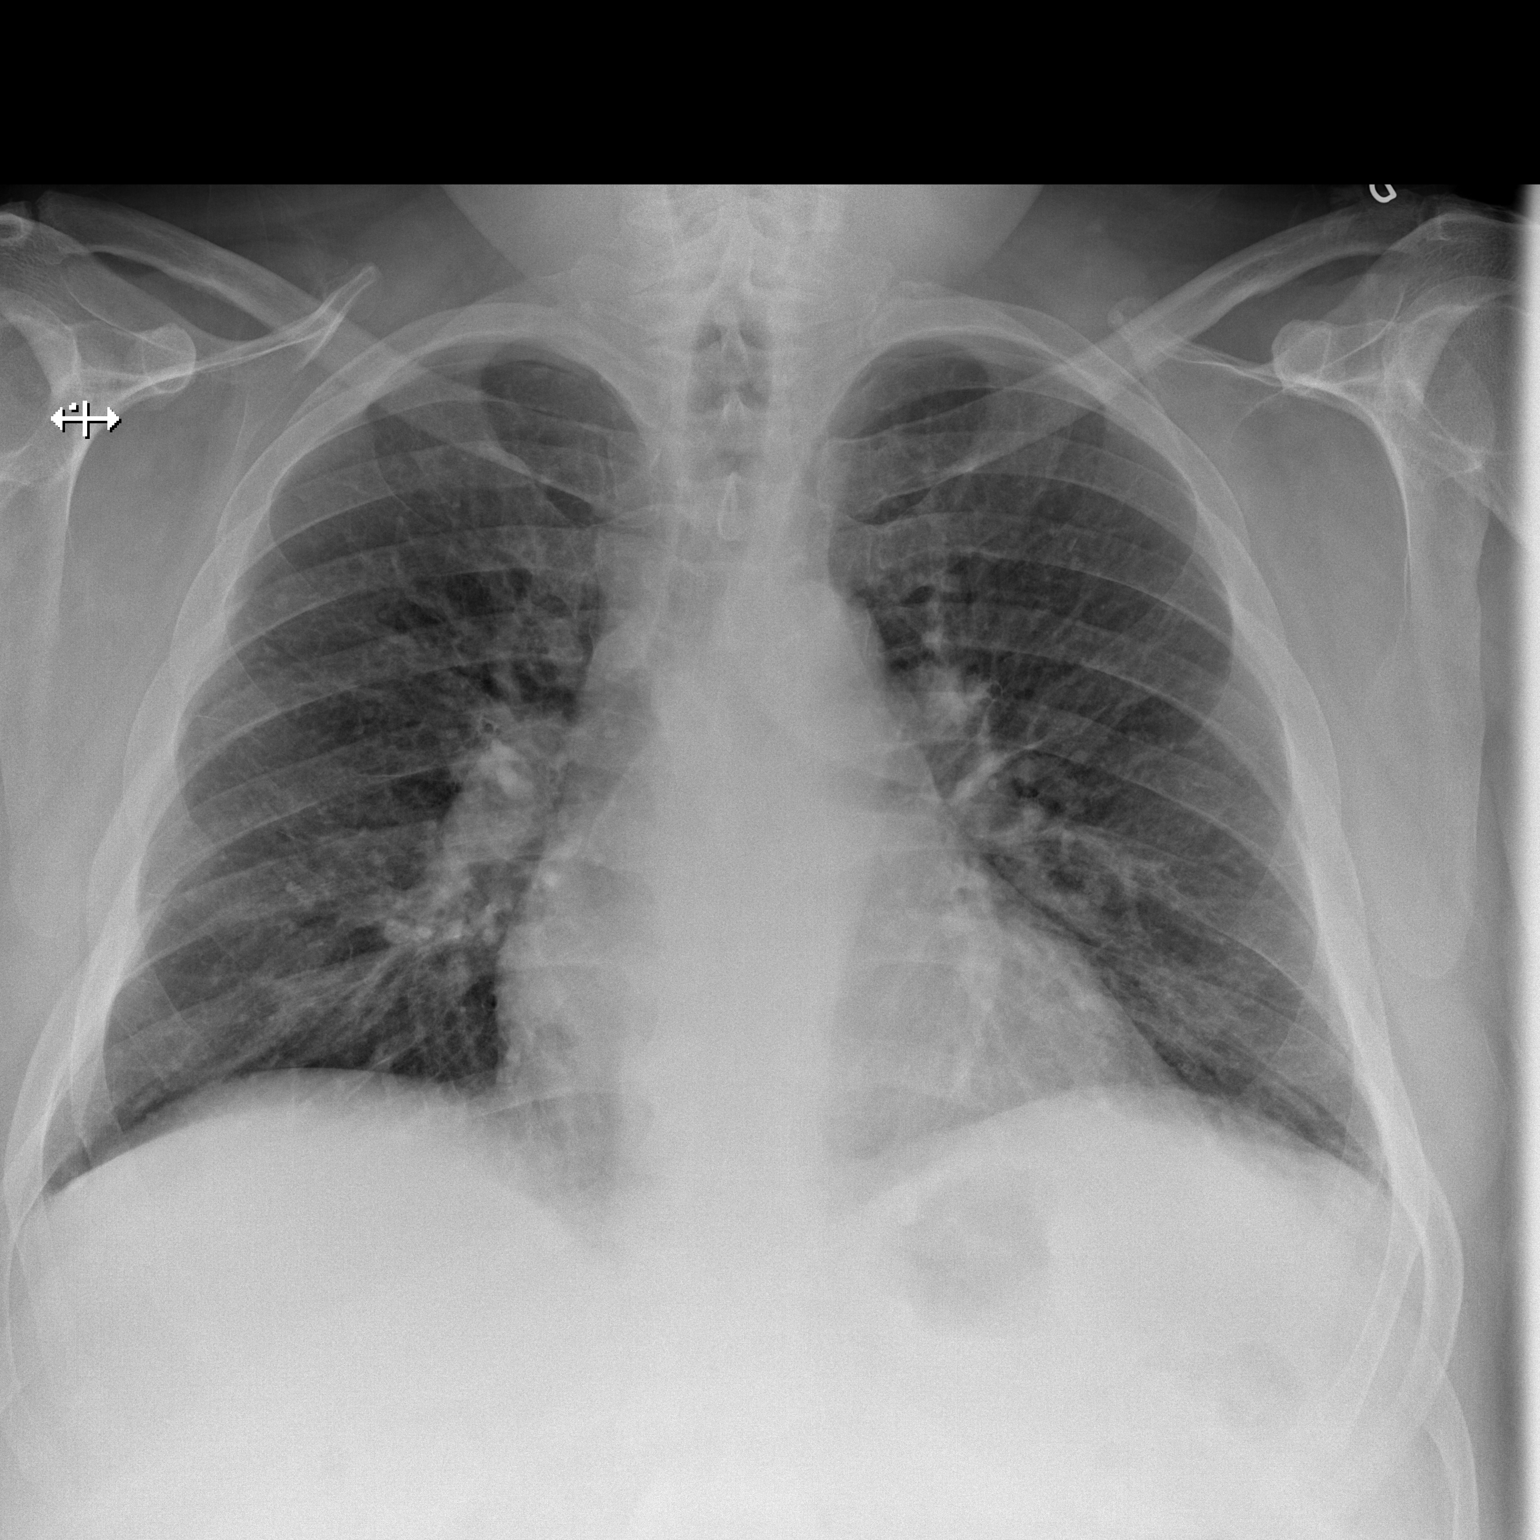

[w chest lat]
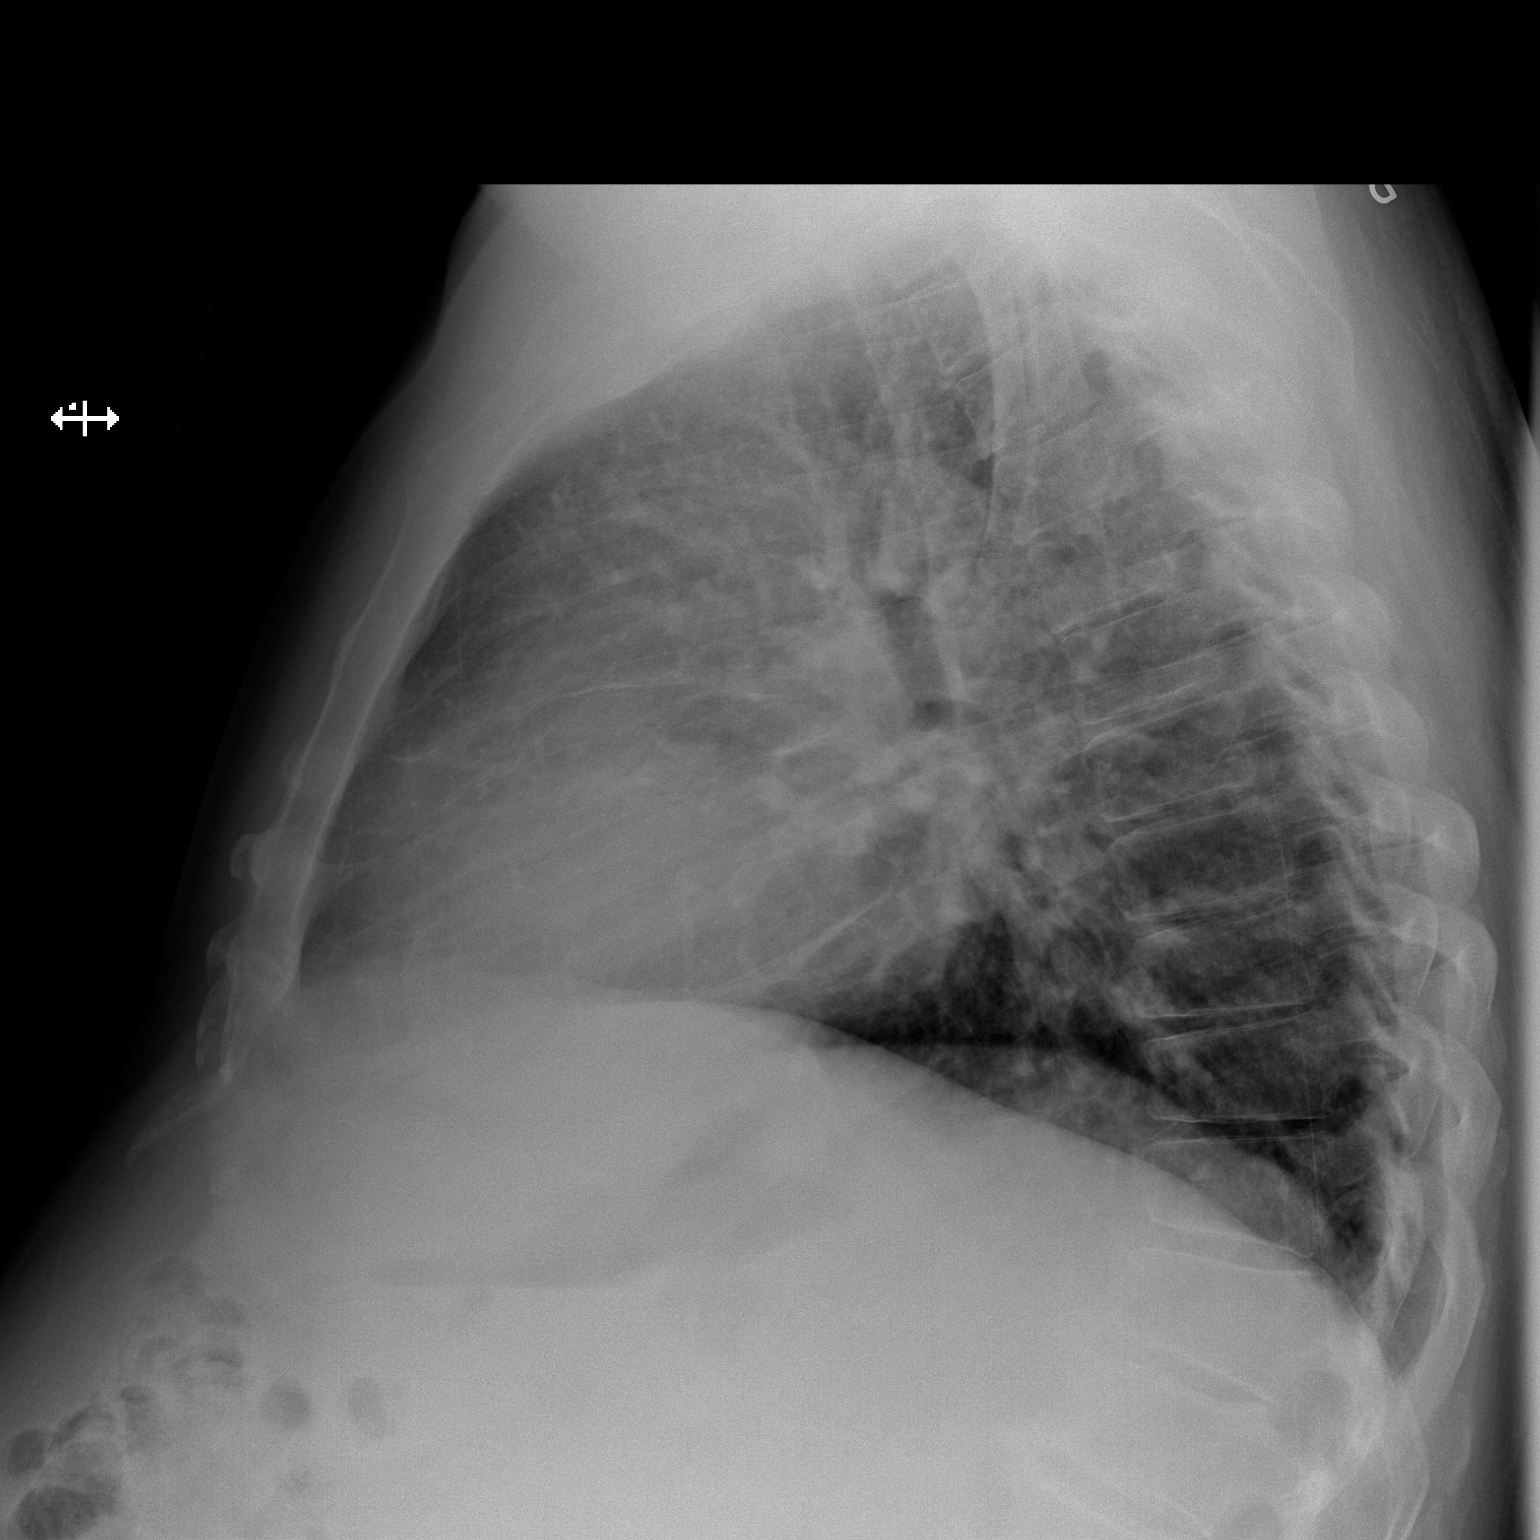

[2 of 2 positions shown; findings below may reference images not displayed]

FINDINGS: Cardiomegaly with pulmonary vascular congestion and
suspected mild interstitial edema. No pleural effusion or
pneumothorax.

Visualized osseous structures are within normal limits.
IMPRESSION: Cardiomegaly with pulmonary vascular congestion and suspected mild
interstitial edema.

## 2013-12-21 ENCOUNTER — Ambulatory Visit (INDEPENDENT_AMBULATORY_CARE_PROVIDER_SITE_OTHER): Payer: BC Managed Care – PPO | Admitting: Internal Medicine

## 2013-12-21 ENCOUNTER — Encounter: Payer: Self-pay | Admitting: Internal Medicine

## 2013-12-21 VITALS — BP 110/80 | HR 132 | Temp 97.5°F | Ht 73.0 in | Wt 288.2 lb

## 2013-12-21 DIAGNOSIS — R059 Cough, unspecified: Secondary | ICD-10-CM

## 2013-12-21 DIAGNOSIS — R05 Cough: Secondary | ICD-10-CM

## 2013-12-21 DIAGNOSIS — I10 Essential (primary) hypertension: Secondary | ICD-10-CM

## 2013-12-21 DIAGNOSIS — R058 Other specified cough: Secondary | ICD-10-CM

## 2013-12-21 MED ORDER — AMOXICILLIN-POT CLAVULANATE 875-125 MG PO TABS: 1.0000 | ORAL_TABLET | Freq: Two times a day (BID) | ORAL | Status: DC

## 2013-12-21 MED ORDER — FAMOTIDINE 20 MG PO TABS: ORAL_TABLET | ORAL | Status: DC

## 2013-12-21 MED ORDER — BISOPROLOL FUMARATE 5 MG PO TABS: 5.0000 mg | ORAL_TABLET | Freq: Every day | ORAL | Status: AC

## 2013-12-21 MED ORDER — PREDNISONE 10 MG PO TABS: ORAL_TABLET | ORAL | Status: DC

## 2013-12-21 MED ORDER — PANTOPRAZOLE SODIUM 40 MG PO TBEC: 40.0000 mg | DELAYED_RELEASE_TABLET | Freq: Every day | ORAL | Status: DC

## 2013-12-21 NOTE — Progress Notes (Signed)
   Subjective:    Patient ID: Craig Wilcox, male    DOB: May 19, 1956  MRN: [id number]  HPI   [age] yowm never smoker on PD x 2013 with new onset cough self referred to pulmonary 12/21/2013 p previous remote dx of chf/cardiac asthma by Dr Delford FieldWright.   12/21/2013 1st [facility] office visit/ Franke Menter  Chief Complaint  Patient presents with  . Pulmonary Consult    Self referral. Laryngitis x 1 month. Steriod injection given, injection helped for 1 week but then "came back". Productive cough with yellow mucous.    onset of cough was acute assoc with hoarsness and doe x more than slow adls with baseline no sob or need for any inhalers, assoc with sense of pnds more day than night and nasal congestion, sore throat by no overt hb  No obvious other patterns in day to day or daytime variabilty or assoc  or cp or chest tightness, subjective wheeze . No unusual exp hx or h/o childhood pna/ asthma or knowledge of premature birth.  Sleeping ok without nocturnal  or early am exacerbation  of respiratory  c/o's or need for noct saba. Also denies any obvious fluctuation of symptoms with weather or environmental changes or other aggravating or alleviating factors except as outlined above   Current Medications, Allergies, Complete Past Medical History, Past Surgical History, Family History, and Social History were reviewed in [facility] electronic medical record.            Review of Systems  Constitutional: Negative for fever, chills, activity change, appetite change and unexpected weight change.  HENT: Positive for congestion, dental problem, postnasal drip, rhinorrhea, sinus pressure, sore throat and voice change. Negative for sneezing and trouble swallowing.   Eyes: Negative for visual disturbance.  Respiratory: Positive for cough and shortness of breath. Negative for choking.   Cardiovascular: Positive for leg swelling. Negative for chest pain.  Gastrointestinal: Negative for nausea, vomiting and  abdominal pain.  Genitourinary: Negative for difficulty urinating.  Musculoskeletal: Negative for arthralgias.  Skin: Negative for rash.  Psychiatric/Behavioral: Negative for behavioral problems and confusion.       Objective:   Physical Exam   Amb hoarse wm nad  Wt Readings from Last 3 Encounters:  12/21/13 288 lb 3.2 oz (130.727 kg)  11/22/08 272 lb 6.4 oz (123.56 kg)  10/18/08 280 lb (127.007 kg)      HEENT: nl dentition, turbinates, and orophanx. Nl external ear canals without cough reflex   NECK :  without JVD/Nodes/TM/ nl carotid upstrokes bilaterally   LUNGS: no acc muscle use, clear to A and P bilaterally without cough on insp or exp maneuvers   CV:  RRR  no s3 or murmur or increase in P2, no edema   ABD:  soft and nontender with nl excursion in the supine position. No bruits or organomegaly, bowel sounds nl  MS:  warm without deformities, calf tenderness, cyanosis or clubbing  SKIN: warm and dry without lesions    NEURO:  alert, approp, no deficits    cxr 10/28/12  Cardiomegaly with pulmonary vascular congestion and suspected mild  interstitial edema      Assessment & Plan:

## 2013-12-21 NOTE — Patient Instructions (Addendum)
Stop coreg and start bisoprolol 5 mg daily - take only half if too strong or light headed standing   Pantoprazole (protonix) 40 mg   Take 30-60 min before first meal of the day and Pepcid 20 mg one bedtime until cough and hoarseness completely gone for a week then try off  Augmentin 875 mg take one pill twice daily  X 10 days - take at breakfast and supper with large glass of water.  It would help reduce the usual side effects (diarrhea and yeast infections) if you ate cultured yogurt at lunch.   Prednisone 10 mg take  4 each am x 2 days,   2 each am x 2 days,  1 each am x 2 days and stop  GERD (REFLUX)  is an extremely common cause of respiratory symptoms, many times with no significant heartburn at all.    It can be treated with medication, but also with lifestyle changes including avoidance of late meals, excessive alcohol, smoking cessation, and avoid fatty foods, chocolate, peppermint, colas, red wine, and acidic juices such as orange juice.  NO MINT OR MENTHOL PRODUCTS SO NO COUGH DROPS  USE SUGARLESS CANDY INSTEAD (jolley ranchers or Stover's)  NO OIL BASED VITAMINS - use powdered substitutes.    Please schedule a follow up office visit in 4 weeks, sooner if needed with cxr on return

## 2013-12-22 DIAGNOSIS — R058 Other specified cough: Secondary | ICD-10-CM | POA: Insufficient documentation

## 2013-12-22 DIAGNOSIS — R05 Cough: Secondary | ICD-10-CM | POA: Insufficient documentation

## 2013-12-22 NOTE — Assessment & Plan Note (Signed)
Strongly prefer in this setting: Bystolic, the most beta -1  selective Beta blocker available in sample form, with bisoprolol the most selective generic choice  on the market.   rec Stop coreg and start bisoprolol trial basis.

## 2013-12-22 NOTE — Assessment & Plan Note (Signed)
Classic Upper airway cough syndrome, so named because it's frequently impossible to sort out how much is  CR/sinusitis with freq throat clearing (which can be related to primary GERD)   vs  causing  secondary (" extra esophageal")  GERD from wide swings in gastric pressure that occur with throat clearing, often  promoting self use of mint and menthol lozenges that reduce the lower esophageal sphincter tone and exacerbate the problem further in a cyclical fashion.   These are the same pts (now being labeled as having "irritable larynx syndrome" by some cough centers) who not infrequently have a history of having failed to tolerate ace inhibitors,  dry powder inhalers or biphosphonates or report having atypical reflux symptoms that don't respond to standard doses of PPI , and are easily confused as having aecopd or asthma flares by even experienced allergists/ pulmonologists.   rec rx for sinusitis and max gerd rx then regroup. Also try off coreg and on bisoprolol in case he really does turn out to have primary airways dz, though doubt it as prev turned out to have cardiac asthma

## 2014-01-18 ENCOUNTER — Ambulatory Visit (INDEPENDENT_AMBULATORY_CARE_PROVIDER_SITE_OTHER): Payer: BC Managed Care – PPO | Admitting: Internal Medicine

## 2014-01-18 ENCOUNTER — Other Ambulatory Visit (INDEPENDENT_AMBULATORY_CARE_PROVIDER_SITE_OTHER): Payer: BC Managed Care – PPO

## 2014-01-18 ENCOUNTER — Encounter: Payer: Self-pay | Admitting: Internal Medicine

## 2014-01-18 VITALS — BP 126/82 | HR 113 | Temp 97.5°F | Ht 73.5 in | Wt 280.0 lb

## 2014-01-18 DIAGNOSIS — R058 Other specified cough: Secondary | ICD-10-CM

## 2014-01-18 DIAGNOSIS — R05 Cough: Secondary | ICD-10-CM

## 2014-01-18 DIAGNOSIS — R059 Cough, unspecified: Secondary | ICD-10-CM

## 2014-01-18 LAB — CBC WITH DIFFERENTIAL/PLATELET
BASOS PCT: 0.4 % (ref 0.0–3.0)
Basophils Absolute: 0.1 10*3/uL (ref 0.0–0.1)
EOS PCT: 4.1 % (ref 0.0–5.0)
Eosinophils Absolute: 0.6 10*3/uL (ref 0.0–0.7)
HEMATOCRIT: 36.8 % — AB (ref 39.0–52.0)
HEMOGLOBIN: 11.9 g/dL — AB (ref 13.0–17.0)
LYMPHS ABS: 1.8 10*3/uL (ref 0.7–4.0)
LYMPHS PCT: 12.8 % (ref 12.0–46.0)
MCHC: 32.2 g/dL (ref 30.0–36.0)
MCV: 93.8 fl (ref 78.0–100.0)
Monocytes Absolute: 1.3 10*3/uL — ABNORMAL HIGH (ref 0.1–1.0)
Monocytes Relative: 9.2 % (ref 3.0–12.0)
NEUTROS ABS: 10.2 10*3/uL — AB (ref 1.4–7.7)
Neutrophils Relative %: 73.5 % (ref 43.0–77.0)
Platelets: 292 10*3/uL (ref 150.0–400.0)
RBC: 3.92 Mil/uL — AB (ref 4.22–5.81)
RDW: 17 % — AB (ref 11.5–14.6)
WBC: 13.8 10*3/uL — AB (ref 4.5–10.5)

## 2014-01-18 MED ORDER — PANTOPRAZOLE SODIUM 40 MG PO TBEC: 40.0000 mg | DELAYED_RELEASE_TABLET | Freq: Every day | ORAL | Status: AC

## 2014-01-18 MED ORDER — FAMOTIDINE 20 MG PO TABS: ORAL_TABLET | ORAL | Status: AC

## 2014-01-18 MED ORDER — PREDNISONE 10 MG PO TABS: ORAL_TABLET | ORAL | Status: AC

## 2014-01-18 NOTE — Patient Instructions (Signed)
Prednisone 10 mg take  4 each am x 2 days,   2 each am x 2 days,  1 each am x 2 days and stop   Pantoprazole (protonix) 40 mg   Take 30-60 min before first meal of the day and Pepcid 20 mg one bedtime until return to office - this is the best way to tell whether stomach acid is contributing to your problem.    Please see patient coordinator before you leave today  to schedule sinus ct > start chlortrimeton 4 mg 2 at bedtime if CT is called back ok   Please remember to go to the lab   department downstairs for your tests - we will call you with the results when they are available.  Please schedule a follow up office visit in 4 weeks, sooner if needed with cxr if still coughing

## 2014-01-18 NOTE — Progress Notes (Signed)
Subjective:    Patient ID: Craig Wilcox, male    DOB: 1956/07/15  MRN: [id number]     Brief patient profile:  [age] yowm never smoker on PD x 2013 with new onset cough self referred to pulmonary 12/21/2013 p previous remote dx of chf/cardiac asthma by Dr Delford FieldWright.  History of Present Illness  12/21/2013 1st [facility] office visit/ [name]  Chief Complaint  Patient presents with  . Pulmonary Consult    Self referral. Laryngitis x 1 month. Steriod injection given, injection helped for 1 week but then "came back". Productive cough with yellow mucous.    onset of cough was acute assoc with hoarsness and doe x more than slow adls with baseline no sob or need for any inhalers, assoc with sense of pnds more day than night and nasal congestion, sore throat but no overt hb rec Stop coreg and start bisoprolol 5 mg daily - take only half if too strong or light headed standing  Pantoprazole (protonix) 40 mg   Take 30-60 min before first meal of the day and Pepcid 20 mg one bedtime until cough and hoarseness completely gone for a week then try off Augmentin 875 mg take one pill twice daily  X 10 days - take at breakfast and supper with large glass of water.  It would help reduce the usual side effects (diarrhea and yeast infections) if you ate cultured yogurt at lunch.  Prednisone 10 mg take  4 each am x 2 days,   2 each am x 2 days,  1 each am x 2 days and stop GERD diet    01/18/2014 f/u ov/[name] re: recurrent cough > sob Chief Complaint  Patient presents with  . Follow-up    Pt had improved alot x 3 wks, but has been worse the past wk with wheezing.    cough and breathing, hoarseness 100% resolved  but drainage was  never better > clear min mucus produced, worse when lie down and note the drainage sensation  preceded  the cough by months.    No obvious day to day or daytime variabilty or assoc  cp or chest tightness  overt   hb symptoms. No unusual exp hx or h/o childhood pna/ asthma or knowledge  of premature birth.  Sleeping ok without nocturnal  or early am exacerbation  of respiratory  c/o's or need for noct saba. Also denies any obvious fluctuation of symptoms with weather or environmental changes or other aggravating or alleviating factors except as outlined above   Current Medications, Allergies, Complete Past Medical History, Past Surgical History, Family History, and Social History were reviewed in [facility] electronic medical record.  ROS  The following are not active complaints unless bolded sore throat, dysphagia, dental problems, itching, sneezing,  nasal congestion or excess/ purulent secretions, ear ache,   fever, chills, sweats, unintended wt loss, pleuritic or exertional cp, hemoptysis,  orthopnea pnd or leg swelling, presyncope, palpitations, heartburn, abdominal pain, anorexia, nausea, vomiting, diarrhea  or change in bowel or urinary habits, change in stools or urine, dysuria,hematuria,  rash, arthralgias, visual complaints, headache, numbness weakness or ataxia or problems with walking or coordination,  change in mood/affect or memory.                       Objective:   Physical Exam   Amb hoarse wm nad  01/18/2014         280  Wt Readings from Last 3 Encounters:  12/21/13 288 lb 3.2 oz (130.727 kg)  11/22/08 272 lb 6.4 oz (123.56 kg)  10/18/08 280 lb (127.007 kg)      HEENT: nl dentition, turbinates, and orophanx. Nl external ear canals without cough reflex   NECK :  without JVD/Nodes/TM/ nl carotid upstrokes bilaterally   LUNGS: no acc muscle use, clear to A and P bilaterally without cough on insp or exp maneuvers   CV:  RRR  no s3 or murmur or increase in P2, no edema   ABD:  soft and nontender with nl excursion in the supine position. No bruits or organomegaly, bowel sounds nl  MS:  warm without deformities, calf tenderness, cyanosis or clubbing  SKIN: warm and dry without lesions    NEURO:  alert, approp, no deficits    cxr  10/28/12  Cardiomegaly with pulmonary vascular congestion and suspected mild  interstitial edema      Assessment & Plan:

## 2014-01-19 ENCOUNTER — Encounter: Payer: Self-pay | Admitting: Internal Medicine

## 2014-01-19 LAB — ALLERGY PROFILE REGION II-DC, DE, MD, ~~LOC~~, VA
Allergen, D pternoyssinus,d7: 0.21 kU/L — ABNORMAL HIGH
Bermuda Grass: 3.61 kU/L — ABNORMAL HIGH
Box Elder IgE: 3.34 kU/L — ABNORMAL HIGH
Cat Dander: <0.1 kU/L
Cockroach: 1.66 kU/L — ABNORMAL HIGH
Common Ragweed: 3.81 kU/L — ABNORMAL HIGH
D. farinae: 0.22 kU/L — ABNORMAL HIGH
Dog Dander: <0.1 kU/L
ELM IGE: 3.48 kU/L — AB
IGE (IMMUNOGLOBULIN E), SERUM: 439.6 [IU]/mL — AB (ref 0.0–180.0)
JOHNSON GRASS: 4.21 kU/L — AB
LAMB'S QUARTERS CLASS: 3.78 kU/L — AB
Meadow Grass: 4.34 kU/L — ABNORMAL HIGH
Oak: 3.41 kU/L — ABNORMAL HIGH
Pecan/Hickory Tree IgE: 2.04 kU/L — ABNORMAL HIGH

## 2014-01-20 NOTE — Assessment & Plan Note (Addendum)
-   allergy profile 01/18/2014> IgE 440 Pos trees/grasses/ragweed/ Eos 4.1%  Problem is at least partially steroid responsive suggesting allergic rhinitis vs eos bronchitis or cough variant asthma all could be contributing to cough .    If sinus CT negative the next step is add 1st gen h1 as per guidelines and consider trial of singulair/dymista then allergy eval and rx in that order  In meantime rx with short course prednisone only   See instructions for specific recommendations which were reviewed directly with the patient who was given a copy with highlighter outlining the key components.

## 2014-01-20 NOTE — Progress Notes (Signed)
Quick Note:  ATC, caller unavailable, WCB ______

## 2014-01-23 NOTE — Progress Notes (Signed)
Quick Note:  ATC, NA and no option to leave a msg, WCB ______ 

## 2014-01-24 ENCOUNTER — Other Ambulatory Visit: Payer: BC Managed Care – PPO

## 2014-01-24 NOTE — Progress Notes (Signed)
Quick Note:  ATC, NA and caller "unavailable at this time" WCB ______

## 2014-02-01 ENCOUNTER — Encounter: Payer: Self-pay | Admitting: *Deleted

## 2014-02-01 NOTE — Progress Notes (Signed)
Quick Note:  Letter mailed to the pt ______ 

## 2014-02-15 ENCOUNTER — Ambulatory Visit: Payer: BC Managed Care – PPO | Admitting: Internal Medicine

## 2014-02-22 ENCOUNTER — Ambulatory Visit: Payer: BC Managed Care – PPO | Admitting: Internal Medicine

## 2015-07-11 ENCOUNTER — Ambulatory Visit: Payer: BLUE CROSS/BLUE SHIELD | Attending: Physical Medicine and Rehabilitation | Admitting: Physical Therapy

## 2015-07-11 ENCOUNTER — Ambulatory Visit: Payer: BLUE CROSS/BLUE SHIELD | Admitting: Occupational Therapy

## 2015-07-11 DIAGNOSIS — R279 Unspecified lack of coordination: Secondary | ICD-10-CM | POA: Insufficient documentation

## 2015-07-11 DIAGNOSIS — R269 Unspecified abnormalities of gait and mobility: Secondary | ICD-10-CM | POA: Diagnosis not present

## 2015-07-11 DIAGNOSIS — R2681 Unsteadiness on feet: Secondary | ICD-10-CM | POA: Insufficient documentation

## 2015-07-11 DIAGNOSIS — R278 Other lack of coordination: Secondary | ICD-10-CM

## 2015-07-11 DIAGNOSIS — R531 Weakness: Secondary | ICD-10-CM | POA: Diagnosis present

## 2015-07-11 NOTE — Therapy (Signed)
[facility] [address] [place], [place], [address] Phone: [phone]   Fax:  [phone]  Occupational Therapy Evaluation  Patient Details  Name: Craig Wilcox MRN: [id number] Date of Birth: February 16, 1956 Referring Provider:  Alison Murray, MD  Encounter Date: 07/11/2015      OT End of Session - 07/11/15 1614    Visit Number 1   Number of Visits 17   Date for OT Re-Evaluation 09/08/15   Authorization Type BC[employer]   OT Start Time 1020   OT Stop Time 1100   OT Time Calculation (min) 40 min   Activity Tolerance Patient tolerated treatment well   Behavior During Therapy [facility] for tasks assessed/performed      Past Medical History  Diagnosis Date  . Chronic kidney disease, stage V requiring chronic dialysis   . CHF (congestive heart failure)     Past Surgical History  Procedure Laterality Date  . Insertion of dialysis catheter      peritoneal  . Cardiac electrophysiology mapping and ablation      There were no vitals filed for this visit.  Visit Diagnosis:  Weakness generalized  Decreased coordination  Abnormality of gait      Subjective Assessment - 07/11/15 1602    Subjective  pt s/p hospitalization for peritonitis with septic shock on 06/06/15, presents with weakness, and coordiantion deficits which impede ADLs/ IADLs   Limitations see Epic   Patient Stated Goals to increase strength and coordination   Currently in Pain? No/denies   Multiple Pain Sites No           [facility] OT Assessment - 07/11/15 0001    Assessment   Diagnosis Bacterial peritonitis, septic shock, and critical illness myopathy   Onset Date 06/06/15   Assessment Pt with hx of ESRD, peritoneal dialysis was hospitalized with critical illness and developed generalized weakness as a result of critical illness myopathy s/p septic shock.   Prior Therapy Pt was d/c home from [facility] on 07/10/15   Precautions   Precautions Fall,  peritoneal dialysis   Balance Screen   Has the patient fallen in the past 6 months No   Has the patient had a decrease in activity level because of a fear of falling?  No   Is the patient reluctant to leave their home because of a fear of falling?  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, sits and assembles Optician, dispensing   Leisure fish, hunt   ADL   Eating/Feeding Modified independent   Grooming Modified independent   Upper Body Bathing --  sponge bathes, distant supervision   Lower Body Bathing --  distant supervision   Upper Body Dressing Supervision/safety   Lower Body Dressing Supervision/safety   Toileting -  Hygiene Supervision/safety  with walker    Tub/Shower Transfer Other (comment)  sponge bath   IADL   Light Housekeeping Needs help with all home maintenance tasks   Mobility   Mobility Status --  supervision with walker   Written Expression   Dominant Hand Right   Cognition   Overall Cognitive  Status Within Functional Limits for tasks assessed   Coordination   9 Hole Peg Test Right;Left   Right 9 Hole Peg Test 36.88 secs   Left 9 Hole Peg Test 31.12 secs   ROM / Strength   AROM / PROM / Strength AROM;Strength   AROM   Overall AROM  Deficits   Overall AROM Comments Bilateral shoulder flexion grossly 110, shoulder abduct: grossly 90 bilaterally, right wrist extension 45, left wrist ext 60, PIP flexion/ clawing for right hand at rest   Strength   Overall Strength Deficits   Overall Strength Comments shoulder strength proximal RUE 3/5, LUE 3+/5, distal strength bilaterally 4/5   Hand Function   Right Hand Grip (lbs) 30 lbs    Left Hand Grip (lbs) 35 lbs                           OT Short Term Goals - 07/11/15 1624    OT SHORT TERM GOAL #1   Title I with HEP.   Baseline due 08/09/15   Time 4   Period Weeks   Status New   OT SHORT TERM GOAL #2   Title Pt will demonstrate an 8 lbs improvement in bilateral grip strength for increased functional use.   Time 4   Period Weeks   Status New   OT SHORT TERM GOAL #3   Title Pt will perform light home management/ cooking task in standing with min A.   Time 4   Period Weeks   Status New   OT SHORT TERM GOAL #4   Title Pt will demonstrate ability to retrieve a light weight object  at 120 bilaterally.   Time 4   Period Weeks   Status New   OT SHORT TERM GOAL #5   Title Pt will increase RUE wrist extension to 55 * for increased functional use.   Time 4   Period Weeks   Status New   Additional Short Term Goals   Additional Short Term Goals Yes   OT SHORT TERM GOAL #6   Title Pt will perform all basic ADLs modified indpendently.   Time 4   Period Weeks   Status New           OT Long Term Goals - 07/11/15 1634    OT LONG TERM GOAL #1   Title Pt will perform basic home management/ cooking at a walker level modified independently without LOB.   Baseline due 09/08/15   Time 8   Period Weeks   Status New   OT LONG TERM GOAL #2   Title Pt will demonstrate improved fine motor coordination as evidenced by decreasing RUE 9 hole peg test score to 32 secs or less.   Time 8   Period Weeks   Status New   OT LONG TERM GOAL #3   Title Pt will perform simulated work activities modified independently.   Time 8   Period Weeks   Status New   OT LONG TERM GOAL #4   Title Pt will demonstrate improved functional reach and standing balance as evidenced by perfoming 10 inches bilaterally on standing functional reach test.   Time 8   Period Weeks   Status New               Plan - 07/11/15 1605    Clinical Impression Statement Pt with  ESRD on perioteneal dialysis was hospitalized on 06/06/15 with bacterial peritonitis, and hypotension.  He developed septic shock and critical illness myopathy. Pt demonstrates decreased strength, decreased coordination and balance which imedes performance of ADLs/ IADLS   Pt will benefit from skilled therapeutic intervention in order to improve on the following deficits (Retired) Abnormal gait;Decreased coordination;Decreased range of motion;Difficulty walking;Decreased safety awareness;Decreased endurance;Decreased activity tolerance;Decreased knowledge of precautions;Pain;Impaired UE functional use;Decreased balance;Decreased mobility;Decreased strength   Rehab Potential Good   OT Frequency 2x / week   OT Duration 8 weeks   OT Treatment/Interventions Self-care/ADL training;Therapeutic exercise;Patient/family education;Splinting;Balance training;Manual Therapy;Neuromuscular education;Energy conservation;Therapeutic exercises;Therapeutic activities;DME and/or AE instruction;Parrafin;Cryotherapy;Electrical Stimulation;Fluidtherapy;Gait Training;Moist Heat;Contrast Bath;Passive range of motion   Plan Issue inital HEP for UE A/ROM, strength, RUE coordination.   Consulted and Agree with Plan of Care Patient        Problem List Patient Active Problem List   Diagnosis Date Noted  . Upper airway cough syndrome 12/22/2013  . OTHER ACUTE SINUSITIS 10/18/2008  . ACUTE BRONCHITIS 10/18/2008  . SLEEP APNEA 01/11/2008  . DIABETES MELLITUS, TYPE II 08/30/2007  . HYPERLIPIDEMIA 08/30/2007  . HYPERTENSION 08/30/2007  . CONGESTIVE HEART FAILURE 08/30/2007  . ALLERGIC RHINITIS 08/30/2007  . OBSTRUCTIVE CHRONIC BRONCHITIS 08/30/2007  . ASTHMA 08/30/2007    RINE,KATHRYN 07/11/2015, 4:35 PM Keene Breath, OTR/L [phone] Phone: [phone] 4:35 PM 07/11/2015 [facility] [address] [place], [place], [address] Phone: [phone]    Fax:  [phone]

## 2015-07-11 NOTE — Therapy (Signed)
[facility] [address] [place], [place], [address] Phone: [phone]   Fax:  [phone]  Physical Therapy Evaluation  Patient Details  Name: Craig Wilcox MRN: [id number] Date of Birth: 1956-09-28 Referring Provider:  Rowe Pavy, MD  Encounter Date: 07/11/2015      PT End of Session - 07/11/15 2021    Visit Number 1   Number of Visits 17  eval + 16 visits   Date for PT Re-Evaluation 09/09/15   Authorization Type BCBS   PT Start Time 1103   PT Stop Time 1147   PT Time Calculation (min) 44 min   Activity Tolerance Patient limited by fatigue   Behavior During Therapy [facility] for tasks assessed/performed      Past Medical History  Diagnosis Date  . Chronic kidney disease, stage V requiring chronic dialysis   . CHF (congestive heart failure)     Past Surgical History  Procedure Laterality Date  . Insertion of dialysis catheter      peritoneal  . Cardiac electrophysiology mapping and ablation      There were no vitals filed for this visit.  Visit Diagnosis:  Abnormality of gait - Plan: PT plan of care cert/re-cert  Unsteadiness - Plan: PT plan of care cert/re-cert  Generalized weakness - Plan: PT plan of care cert/re-cert      Subjective Assessment - 07/11/15 1109    Subjective Pt hospitalized 06/04/15 for septic shock, bacterial peritonitis, pericardial effusion. Developed VDRF; extubated on 06/12/15. Pt discharged from acute care to [facility] inpatient rehab; discharged home with sister on 07/09/15 . Son (teaches Tourist information centre manager) present at home with patient all day. Pt reports perception of limited endurance, LE weakness, and poor balance.   Pertinent History Goes by Craig Wilcox". PMH: CKD, ESRD (performs peritoneal dialysis at home), CHF, DM II, COPD, HLD, gout, OSA, s/p osteomyelitis of L foot   Patient Stated Goals "Get back to work as soon as I can, get my endurance up, and be able to get up and walk around  better."   Currently in Pain? No/denies            [facility] PT Assessment - 07/11/15 1129    Assessment   Medical Diagnosis critical illness myopathy   Onset Date/Surgical Date 06/04/15   Hand Dominance Right   Precautions   Precautions Fall   Restrictions   Weight Bearing Restrictions No   Balance Screen   Has the patient fallen in the past 6 months No   Home Environment   Living Environment Private residence   Living Arrangements Children   Available Help at Discharge Family  son   Type of Home House  level entry   Home Access Level entry   Home Layout One level   Home Equipment Walker - 2 wheels   Additional Comments Pt currently with sister. Pt's home has 4 steps with no rails. Once inside home, pt has to negotiate 7 stairs with no rails.   Prior Function   Level of Independence Independent   Vocation Full time employment   Vocation Requirements works for [employer]. 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, sits and assembles Optician, dispensing   Leisure fish, hunt   Observation/Other Assessments   Other Surveys  Other Surveys   Activities of Corporate investment banker Scale (ABC Scale)  0.6%   Sensation   Light Touch Appears Intact   Proprioception Appears Intact   Coordination   Gross Motor Movements are Fluid and Coordinated Yes  ROM / Strength   AROM / PROM / Strength Strength   Strength   Overall Strength Deficits   Strength Assessment Site Hip;Knee;Ankle   Right/Left Hip Right;Left   Right Hip Flexion 3+/5   Left Hip Flexion 3-/5   Right/Left Knee Right;Left   Right Knee Flexion 4/5   Right Knee Extension 3-/5   Left Knee Flexion 4-/5   Left Knee Extension 3-/5   Right/Left Ankle Right;Left   Right Ankle Dorsiflexion 3-/5   Right Ankle Plantar Flexion 4/5   Left Ankle Dorsiflexion 2+/5   Left Ankle Plantar Flexion 4-/5   Transfers   Transfers Sit to Stand;Stand to Sit   Sit to Stand 5: Supervision;4: Min assist   Sit to Stand Details (indicate cue type and reason) Unable to  perform sit > stand from standard chair without UE use; requires min A for sit > stand with single UE use   Stand to Sit 5: Supervision;4: Min guard   Stand to Sit Details Min guard for stand > sit with single UE use   Ambulation/Gait   Ambulation/Gait Yes   Ambulation/Gait Assistance 6: Modified independent (Device/Increase time);5: Supervision   Ambulation/Gait Assistance Details Mod I for initial 350'; requires supervision for remainder of ambulation secondary to fatigue, gait deviations   Ambulation Distance (Feet) 692 Feet    Assistive device Rolling walker   Gait Pattern Step-through pattern;Decreased dorsiflexion - left;Left foot flat;Right foot flat;Trendelenburg;Lateral hip instability;Trunk flexed  B Trendelenburg with increased distance   Ambulation Surface Level;Indoor   Gait velocity 2.73 ft/sec   6 minute walk test results    Aerobic Endurance Distance Walked 692   Endurance additional comments using RW. Standing rest break x45 seconds after 3 minutes, 45 seconds; standing rest break x15 seconds after ambulating x4.5 minutes   Balance   Balance Assessed Yes   Static Standing Balance   Static Standing - Balance Support No upper extremity supported   Static Standing - Comment/# of Minutes Static standing with eyes closed < 15 seconds prior to LOB. Static standing with narrow BOS <10 seconds prior to LOB.   Standardized Balance Assessment   Standardized Balance Assessment --                           PT Education - 07/11/15 2020    Education provided Yes   Education Details Pt eval findings, goals, and POC.   Person(s) Educated Patient   Methods Explanation   Comprehension Verbalized understanding          PT Short Term Goals - 07/11/15 2026    PT SHORT TERM GOAL #1   Title Pt will perform initial HEP with mod I using paper handout to maximize functional gains made in physical therapy. Target date: 08/08/15   PT SHORT TERM GOAL #2   Title  Pt will increase 6 Minute Walk Test distance from 692' to 781' to indicate progress toward significant improvement in functional endurance. Target date: 08/08/15   PT SHORT TERM GOAL #3   Title Perform Berg Balance Scale to assess/address functional standing balance. Target date: 08/08/15   PT SHORT TERM GOAL #4   Title Pt will perform sit to stand transfer from standard chair without use of UE's or compensatory strategies to indicate improved functional LE strength. Target date: 08/08/15   PT SHORT TERM GOAL #5   Title --   Additional Short Term Goals   Additional Short Term Goals Yes  PT SHORT TERM GOAL #6   Title --           PT Long Term Goals - 07/11/15 2036    PT LONG TERM GOAL #1   Title Pt will improve Berg score by 8 points from baseline to indicate significant improvement in functional standing balance.  Target date: 09/05/15   PT LONG TERM GOAL #2   Title Pt will increase 6 Minute Walk Test distance from 692' to 869' to indicate significant improvement in functional endurance.  Target date: 09/05/15   PT LONG TERM GOAL #3   Title Pt will independently ambulate 200' over level, indoor surfaces with effective obstacle negotiation to indicate independence with household mobility, progress toward return to PLOF.  Target date: 09/05/15   PT LONG TERM GOAL #4   Title Pt will ambulate x500' over paved, outdoor surfaces with mod I using LRAD to indicate increased safety with community mobility.  Target date: 09/05/15   PT LONG TERM GOAL #5   Title Pt will negotiate standard ramp and curb step with mod I using LRAD to indicate ability to safely traverse community obstacles.  Target date: 09/05/15   Additional Long Term Goals   Additional Long Term Goals Yes   PT LONG TERM GOAL #6   Title Pt will improve ABC Scale from 0.6% to >14 % to indicate significant improvement in balance confidence. Target date: 09/05/15   PT LONG TERM GOAL #7   Title Pt will negotiate 7 stairs with single rail  and mod I to enable to access all levels of home.  Target date: 09/05/15  Pt reports plan to install rails in home prior to moving home from sister's house               Plan - 07/11/15 2023    Clinical Impression Statement Pt is a 59 y/o M referred to outpatient neuro PT to address functional impairments associated with critical illness myopathy after recent hospitalization (from 06/04/15 -  07/09/15 including inpatient rehab) for septic shock, bacterial peritonitis. PT evaluationr reveals the following impairments: generalized weakness; gait impairments; decreased functional endurance, as exhibited by ; balance impairments; and decreased stability/independence with functional mobility. Pt will benefit from skilled outpatient PT 2x/week for 8 weeks to address said impairments.   Pt will benefit from skilled therapeutic intervention in order to improve on the following deficits Abnormal gait;Cardiopulmonary status limiting activity;Decreased activity tolerance;Decreased balance;Decreased strength;Decreased endurance;Decreased knowledge of use of DME   Rehab Potential Good   PT Frequency 2x / week   PT Duration 8 weeks   PT Treatment/Interventions ADLs/Self Care Home Management;Vestibular;Manual techniques;Therapeutic activities;Therapeutic exercise;Balance training;Neuromuscular re-education;Gait training;Patient/family education;Functional mobility training;Orthotic Fit/Training;Stair training;DME Instruction   PT Next Visit Plan Complete Berg; initial HEP with focus on functional LE strengthening   Consulted and Agree with Plan of Care Patient         Problem List Patient Active Problem List   Diagnosis Date Noted  . Upper airway cough syndrome 12/22/2013  . OTHER ACUTE SINUSITIS 10/18/2008  . ACUTE BRONCHITIS 10/18/2008  . SLEEP APNEA 01/11/2008  . DIABETES MELLITUS, TYPE II 08/30/2007  . HYPERLIPIDEMIA 08/30/2007  . HYPERTENSION 08/30/2007  . CONGESTIVE HEART FAILURE  08/30/2007  . ALLERGIC RHINITIS 08/30/2007  . OBSTRUCTIVE CHRONIC BRONCHITIS 08/30/2007  . ASTHMA 08/30/2007    Jorje Guild, PT, DPT [facility] [address] [place], [place], [address] Phone: [phone]   Fax:  [phone] 07/11/2015, 9:15 PM

## 2015-07-17 ENCOUNTER — Encounter: Payer: Self-pay | Admitting: Physical Therapy

## 2015-07-17 ENCOUNTER — Ambulatory Visit: Payer: BLUE CROSS/BLUE SHIELD | Admitting: Occupational Therapy

## 2015-07-17 ENCOUNTER — Ambulatory Visit: Payer: BLUE CROSS/BLUE SHIELD | Admitting: Physical Therapy

## 2015-07-17 DIAGNOSIS — R279 Unspecified lack of coordination: Secondary | ICD-10-CM

## 2015-07-17 DIAGNOSIS — R531 Weakness: Secondary | ICD-10-CM

## 2015-07-17 DIAGNOSIS — R269 Unspecified abnormalities of gait and mobility: Secondary | ICD-10-CM

## 2015-07-17 DIAGNOSIS — R278 Other lack of coordination: Secondary | ICD-10-CM

## 2015-07-17 DIAGNOSIS — R2681 Unsteadiness on feet: Secondary | ICD-10-CM

## 2015-07-17 NOTE — Patient Instructions (Signed)
"  I love a Parade" Lift   At counter for balance as needed: high knee marching forward and then backward. 3 second pauses with each knee lift.  Repeat 3 laps each way. Do _1-2_ sessions per day. http://gt2.exer.us/345   Copyright  VHI. All rights reserved.  Side-Stepping   At counter for balance as needed: Walk to left side with eyes open. Take even steps, leading with same foot. Make sure each foot lifts off the floor. Repeat in opposite direction. Keep feet pointed toward counter. Repeat for 3 laps each way.  Do _1-2__ sessions per day.  Copyright  VHI. All rights reserved.  Walking on Heels   At counter: Walk on heels forward while continuing on a straight path, and then walk on heels backward to starting position. Repeat for 3 laps each way. Do _1-2__ sessions per day.  Copyright  VHI. All rights reserved.  Walking on Toes   At counter for balance as needed: Walk on toes forward while continuing on a straight path, and then backwards on toes to starting position. Repeat 3 laps each way. Do _1-2___ sessions per day.  Copyright  VHI. All rights reserved.  Feet Heel-Toe "Tandem"   At counter: Arms at sides, walk a straight line forward bringing one foot directly in front of the other, and then a straight line backwards bringing one foot directly behind the other one.  Repeat for _3 laps each way. Do _1-2_ sessions per day.  Copyright  VHI. All rights reserved.   

## 2015-07-19 NOTE — Therapy (Signed)
[facility] [address] [place], [place], [address] Phone: [phone]   Fax:  [phone]  Physical Therapy Treatment  Patient Details  Name: Craig Wilcox MRN: [id number] Date of Birth: 14-Jun-1956 Referring Provider:  Rowe Pavy, MD  Encounter Date: 07/17/2015   07/17/15 1408  PT Visits / Re-Eval  Visit Number 2  Number of Visits 17 (eval + 16 visits)  Date for PT Re-Evaluation 09/09/15  Authorization  Authorization Type BCBS  PT Time Calculation  PT Start Time 1402  PT Stop Time 1445  PT Time Calculation (min) 43 min  PT - End of Session  Activity Tolerance Patient limited by fatigue  Behavior During Therapy [facility] for tasks assessed/performed     Past Medical History  Diagnosis Date  . Chronic kidney disease, stage V requiring chronic dialysis   . CHF (congestive heart failure)     Past Surgical History  Procedure Laterality Date  . Insertion of dialysis catheter      peritoneal  . Cardiac electrophysiology mapping and ablation      There were no vitals filed for this visit.  Visit Diagnosis:  Weakness generalized  Decreased coordination  Abnormality of gait  Unsteadiness  Generalized weakness     07/17/15 1408  Symptoms/Limitations  Subjective No new complaints. No falls or pain to report. Reports his INR was elevated on last blood draw check on last Monday, not sure the number. Goes again on Monday to have it checked again, MD held Thursday, Friday, and Saturday doses and then halved dosages starting on this past /23/16 1209  PT Education  Education provided Yes  Education Details HEP for strengthening and balance  Person(s) Educated Patient  Methods Explanation;Demonstration;Handout  Comprehension Verbalized understanding;Returned demonstration           PT Short Term Goals - 07/11/15 2026    PT SHORT TERM GOAL #1   Title Pt will perform initial HEP with mod I using paper handout to maximize functional gains made in physical therapy. Target date: 08/08/15   PT SHORT TERM GOAL #2   Title Pt will increase 6 Minute Walk Test distance from 692' to 781' to indicate progress toward significant improvement in functional endurance. Target date: 08/08/15   PT SHORT TERM GOAL #3   Title Perform Berg Balance Scale to  assess/address functional standing balance. Target date: 08/08/15   PT SHORT TERM GOAL #4   Title Pt will perform sit to stand transfer from standard chair without use of UE's or compensatory strategies to indicate improved functional LE strength. Target date: 08/08/15   PT SHORT TERM GOAL #5   Title --   Additional Short Term Goals   Additional Short Term Goals Yes   PT SHORT TERM GOAL #6   Title --           PT Long Term Goals - 07/11/15 2036    PT LONG TERM GOAL #1   Title Pt will improve Berg score by 8 points from baseline to indicate significant improvement in functional standing balance.  Target date: 09/05/15   PT LONG  TERM GOAL #2   Title Pt will increase 6 Minute Walk Test distance from 692' to 869' to indicate significant improvement in functional endurance.  Target date: 09/05/15   PT LONG TERM GOAL #3   Title Pt will independently ambulate 200' over level, indoor surfaces with effective obstacle negotiation to indicate independence with household mobility, progress toward return to PLOF.  Target date: 09/05/15   PT LONG TERM GOAL #4   Title Pt will ambulate x500' over paved, outdoor surfaces with mod I using LRAD to indicate increased safety with community mobility.  Target date: 09/05/15   PT LONG TERM GOAL #5   Title Pt will negotiate standard ramp and curb step with mod I using LRAD to indicate ability to safely traverse community obstacles.  Target date: 09/05/15   Additional Long Term Goals   Additional Long Term Goals Yes   PT LONG TERM GOAL #6   Title Pt will improve ABC Scale from 0.6% to >14 % to indicate significant improvement in balance confidence. Target date: 09/05/15   PT LONG TERM GOAL #7   Title Pt will negotiate 7 stairs with single rail and mod I to enable to access all levels of home.  Target date: 09/05/15  Pt reports plan to install rails in home prior to moving home from sister's house        07/17/15 1408  Plan  Clinical Impression Statement Pt educated on and issued HEP today without any issues reported. Making steady progress toward goals.  Pt will benefit from skilled therapeutic intervention in order to improve on the following deficits Abnormal gait;Cardiopulmonary status limiting activity;Decreased activity tolerance;Decreased balance;Decreased strength;Decreased endurance;Decreased knowledge of use of DME  Rehab Potential Good  PT Frequency 2x / week  PT Duration 8 weeks  PT Treatment/Interventions ADLs/Self Care Home Management;Vestibular;Manual techniques;Therapeutic activities;Therapeutic exercise;Balance training;Neuromuscular re-education;Gait training;Patient/family  education;Functional mobility training;Orthotic Fit/Training;Stair training;DME Instruction  PT Next Visit Plan Continue to work on balance and strengthening toward STG's.  Consulted and Agree with Plan of Care Patient      Problem List Patient Active Problem List   Diagnosis Date Noted  . Upper airway cough syndrome 12/22/2013  . OTHER ACUTE SINUSITIS 10/18/2008  . ACUTE BRONCHITIS 10/18/2008  . SLEEP APNEA 01/11/2008  . DIABETES MELLITUS, TYPE II 08/30/2007  . HYPERLIPIDEMIA 08/30/2007  . HYPERTENSION 08/30/2007  . CONGESTIVE HEART FAILURE 08/30/2007  . ALLERGIC RHINITIS 08/30/2007  . OBSTRUCTIVE CHRONIC BRONCHITIS 08/30/2007  . ASTHMA 08/30/2007    Sallyanne Kuster 07/19/2015, 4:18 PM  Sallyanne Kuster, PTA, [facility] [address] [place], [place] [address] [phone] 07/20/2015, 12:10 PM

## 2015-07-20 ENCOUNTER — Ambulatory Visit: Payer: BLUE CROSS/BLUE SHIELD | Admitting: Occupational Therapy

## 2015-07-20 ENCOUNTER — Ambulatory Visit: Payer: BLUE CROSS/BLUE SHIELD | Admitting: Physical Therapy

## 2015-07-20 DIAGNOSIS — R269 Unspecified abnormalities of gait and mobility: Secondary | ICD-10-CM

## 2015-07-20 DIAGNOSIS — R278 Other lack of coordination: Secondary | ICD-10-CM

## 2015-07-20 DIAGNOSIS — R279 Unspecified lack of coordination: Principal | ICD-10-CM

## 2015-07-20 DIAGNOSIS — R531 Weakness: Secondary | ICD-10-CM

## 2015-07-20 NOTE — Patient Instructions (Signed)
Functional Quadriceps: Sit to Stand   Sit on the edge of your high bed with your feet on the ground. Make sure feet aren't too far apart. Tie the bright GREEN band around your legs (just above knees). Stand up without using your hands. Then, slowly sit down (without plopping). Do 8 reps. Performed 4-6 sets  per day, every day.  Abduction: Clam (Eccentric) - Side-Lying   Lie on side with knees bent. Lift top knee, keeping feet together. Keep trunk steady. You should feel this in you "back pocket" muscle; if you don't, you're doing it wrong. Slowly lower for 3-5 seconds. 20 reps, 3 times per day.   HIP: Flexion / KNEE: Extension, Straight Leg Raise   Raise leg, keeping knee straight. SLOWLY lower your leg back down to resting position. Make sure your heel and the back of your knee touch the bed at the same time.   Do 5 reps on the right leg. Then switch to the left leg, performing 5 reps. Rest after 3 trials on each side.  Do 3 sets per day.

## 2015-07-20 NOTE — Therapy (Signed)
[facility] [address] [place], [place], [address] Phone: [phone]   Fax:  [phone]  Occupational Therapy Treatment  Patient Details  Name: Craig Wilcox MRN: [id number] Date of Birth: [birth year] Referring Provider:  Rowe Pavy, MD  Encounter Date: 07/20/2015      OT End of Session - 07/20/15 1146    Visit Number 2   Date for OT Re-Evaluation 09/08/15   Authorization Type BCBS   OT Start Time 1102   OT Stop Time 1145   OT Time Calculation (min) 43 min   Activity Tolerance Patient tolerated treatment well   Behavior During Therapy [facility] for tasks assessed/performed      Past Medical History  Diagnosis Date  . Chronic kidney disease, stage V requiring chronic dialysis   . CHF (congestive heart failure)     Past Surgical History  Procedure Laterality Date  . Insertion of dialysis catheter      peritoneal  . Cardiac electrophysiology mapping and ablation      There were no vitals filed for this visit.  Visit Diagnosis:  Decreased coordination  Weakness generalized  Abnormality of gait      Subjective Assessment - 07/20/15 1117    Subjective  Pt reports no changes since last visit   Limitations see Epic   Currently in Pain? No/denies   Multiple Pain Sites No      Treatment: Pt was instructed in coordination HEP for RUE and putty exercises( see pt instructions). Patient returned demonstration.                        OT Education - 07/20/15 1249    Education Details coordination, putty ex   Person(s) Educated Patient   Methods Explanation;Handout;Demonstration   Comprehension Verbalized understanding;Returned demonstration          OT Short Term Goals - 07/11/15 1624    OT SHORT TERM GOAL #1   Title I with HEP.   Baseline due 08/09/15   Time 4   Period Weeks   Status New   OT SHORT TERM GOAL #2   Title Pt will demonstrate an 8 lbs improvement in bilateral  grip strength for increased functional use.   Time 4   Period Weeks   Status New   OT SHORT TERM GOAL #3   Title Pt will perform light home management/ cooking task in standing with min A.   Time 4   Period Weeks   Status New   OT SHORT TERM GOAL #4   Title Pt will demonstrate ability to retrieve a light weight object  at 120 bilaterally.   Time 4   Period Weeks   Status New   OT SHORT TERM GOAL #5   Title Pt will increase RUE wrist extension to 55 * for increased functional use.   Time 4   Period Weeks   Status New   Additional Short Term Goals   Additional Short Term Goals Yes   OT SHORT TERM GOAL #6   Title Pt will perform all basic ADLsz modified indpendently.   Time 4   Period Weeks   Status New           OT Long Term Goals - 07/11/15 1634    OT LONG TERM GOAL #1   Title Pt will perform basic home management/ cooking at a walker level modified independently without LOB.   Baseline due 09/08/15   Time 8  Period Weeks   Status New   OT LONG TERM GOAL #2   Title Pt will demonstrate improved fine motor coordination as evidenced by decreasing RUE 9 hole peg test score to 32 secs or less.   Time 8   Period Weeks   Status New   OT LONG TERM GOAL #3   Title Pt will perform simulated work activities modified independently.   Time 8   Period Weeks   Status New   OT LONG TERM GOAL #4   Title Pt will demonstrate improved functional reach and standing balance as evidenced by perfoming 10 inches bilaterally on standing functional reach test.   Time 8   Period Weeks   Status New               Plan - 07/20/15 1121    Clinical Impression Statement Pt is progressing towards goals for RUE coordination   Pt will benefit from skilled therapeutic intervention in order to improve on the following deficits (Retired) Abnormal gait;Decreased coordination;Decreased range of motion;Difficulty walking;Decreased safety awareness;Decreased endurance;Decreased activity  tolerance;Decreased knowledge of precautions;Pain;Impaired UE functional use;Decreased balance;Decreased mobility;Decreased strength   Rehab Potential Good   OT Frequency 2x / week   OT Duration 8 weeks   OT Treatment/Interventions Self-care/ADL training;Therapeutic exercise;Patient/family education;Splinting;Balance training;Manual Therapy;Neuromuscular education;Energy conservation;Therapeutic exercises;Therapeutic activities;DME and/or AE instruction;Parrafin;Cryotherapy;Electrical Stimulation;Fluidtherapy;Gait Training;Moist Heat;Contrast Bath;Passive range of motion   Plan reinforce coordination activities, consider anti claw splint for RUE(? ulnar nerve compression)   Consulted and Agree with Plan of Care Patient        Problem List Patient Active Problem List   Diagnosis Date Noted  . Upper airway cough syndrome 12/22/2013  . OTHER ACUTE SINUSITIS 10/18/2008  . ACUTE BRONCHITIS 10/18/2008  . SLEEP APNEA 01/11/2008  . DIABETES MELLITUS, TYPE II 08/30/2007  . HYPERLIPIDEMIA 08/30/2007  . HYPERTENSION 08/30/2007  . CONGESTIVE HEART FAILURE 08/30/2007  . ALLERGIC RHINITIS 08/30/2007  . OBSTRUCTIVE CHRONIC BRONCHITIS 08/30/2007  . ASTHMA 08/30/2007    RINE,KATHRYN 07/20/2015, 12:51 PM Keene Breath, OTR/L [phone] Phone: [phone] 12:51 PM 07/20/2015 [facility] [address] [place], [place], [address] Phone: [phone]   Fax:  [phone]

## 2015-07-20 NOTE — Patient Instructions (Signed)
1. Grip Strengthening (Resistive Putty)   Squeeze putty using thumb and all fingers. Repeat _20___ times. Do __2__ sessions per day.   2. Roll putty into tube on table and pinch between each finger and thumb x 10 reps each. (can do ring and small finger together)  Flatten putty then stretch putty out with finger tips, 10-20 reps 1x day    Coordination Activities  Perform the following activities for 20  minutes 1-2 times per day with right hand(s).   Rotate ball in fingertips (clockwise and counter-clockwise).  Toss ball between hands.  Toss ball in air and catch with the same hand.  Flip cards 1 at a time as fast as you can.  Deal cards with your thumb (Hold deck in hand and push card off top with thumb).  Pick up coins, buttons, marbles, dried beans/pasta of different sizes and place in container.  Pick up coins and place in container or coin bank.  Pick up coins and stack.  Pick up coins one at a time until you get 5-10 in your hand, then move coins from palm to fingertips to stack one at a time.  Practice writing and/or typing.     Copyright  VHI. All rights reserved.

## 2015-07-20 NOTE — Therapy (Signed)
[facility] [address] [place], [place], [address] Phone: [phone]   Fax:  [phone]  Physical Therapy Treatment  Patient Details  Name: Craig Wilcox MRN: [id number] Date of Birth: [birth year] Referring Provider:  Rowe Pavy, MD  Encounter Date: 07/20/2015      PT End of Session - 07/20/15 1249    Visit Number 3   Number of Visits 17   Date for PT Re-Evaluation 09/09/15   Authorization Type BCBS   PT Start Time 1155   PT Stop Time 1236   PT Time Calculation (min) 41 min   Activity Tolerance Patient limited by fatigue   Behavior During Therapy [facility] for tasks assessed/performed      Past Medical History  Diagnosis Date  . Chronic kidney disease, stage V requiring chronic dialysis   . CHF (congestive heart failure)     Past Surgical History  Procedure Laterality Date  . Insertion of dialysis catheter      peritoneal  . Cardiac electrophysiology mapping and ablation      There were no vitals filed for this visit.  Visit Diagnosis:  Weakness generalized  Abnormality of gait      Subjective Assessment - 07/20/15 1159    Subjective Pt walked all the way around [place] over the weekned. Pt denies falls.   Pertinent History Goes by [name]". PMH: CKD, ESRD (performs peritoneal dialysis at home), CHF, DM II, COPD, HLD, gout, OSA, s/p osteomyelitis of L foot   Patient Stated Goals "Get back to work as soon as I can, get my endurance up, and be able to get up and walk around better."   Currently in Pain? No/denies                         [facility] Adult PT Treatment/Exercise - 07/20/15 0001    Transfers   Transfers Sit to Stand;Stand to Sit   Sit to Stand 5: Supervision   Sit to Stand Details (indicate cue type and reason) able to perform sit <> stand without UE support from  elevated surface (>22") only.  Performed blocked practice with verbal, demonstration cueing for setup, technique.    Stand to Sit 5: Supervision   Stand to Sit Details See above. Cueing focused on slow, controlled descent.   Ambulation/Gait   Ambulation/Gait Yes   Ambulation/Gait Assistance 4: Min guard;4: Min assist;6: Modified independent (Device/Increase time)   Ambulation/Gait Assistance Details x200' with RW with Mod I; x230' with SPC with min guard-min A, cueing for upright posture, forward gaze.   Ambulation Distance (Feet) 430 Feet   Assistive device Rolling walker;Straight cane   Gait Pattern Step-through pattern;Decreased dorsiflexion - left;Left foot flat;Right foot flat;Trendelenburg;Lateral hip instability;Trunk flexed   Ambulation Surface Level;Indoor   Exercises   Exercises Other Exercises   Other Exercises  Explained, demonstrated home exercises (focus on hip strengthening). Pt with effective return demonstration with verbal/tactile cueing. See Pt Instructions for details on all exercises, reps (performed to pt fatigue), sets, and frequency.                 PT Education - 07/20/15 1228    Education provided Yes   Education Details Added exercises to HEP.   Person(s) Educated Patient   Methods Explanation;Demonstration;Tactile cues;Verbal cues;Handout   Comprehension Verbalized understanding;Returned demonstration          PT Short Term Goals - 07/11/15 2026    PT SHORT TERM GOAL #1  Title Pt will perform initial HEP with mod I using paper handout to maximize functional gains made in physical therapy. Target date: 08/08/15   PT SHORT TERM GOAL #2   Title Pt will increase 6 Minute Walk Test distance from 692' to 781' to indicate progress toward significant improvement in functional endurance. Target date: 08/08/15   PT SHORT TERM GOAL #3   Title Perform Berg Balance Scale to assess/address functional standing balance. Target date: 08/08/15   PT SHORT TERM GOAL #4   Title Pt will perform sit to stand transfer from standard chair without use of UE's or compensatory  strategies to indicate improved functional LE strength. Target date: 08/08/15   PT SHORT TERM GOAL #5   Title --   Additional Short Term Goals   Additional Short Term Goals Yes   PT SHORT TERM GOAL #6   Title --           PT Long Term Goals - 07/11/15 2036    PT LONG TERM GOAL #1   Title Pt will improve Berg score by 8 points from baseline to indicate significant improvement in functional standing balance.  Target date: 09/05/15   PT LONG TERM GOAL #2   Title Pt will increase 6 Minute Walk Test distance from 692' to 869' to indicate significant improvement in functional endurance.  Target date: 09/05/15   PT LONG TERM GOAL #3   Title Pt will independently ambulate 200' over level, indoor surfaces with effective obstacle negotiation to indicate independence with household mobility, progress toward return to PLOF.  Target date: 09/05/15   PT LONG TERM GOAL #4   Title Pt will ambulate x500' over paved, outdoor surfaces with mod I using LRAD to indicate increased safety with community mobility.  Target date: 09/05/15   PT LONG TERM GOAL #5   Title Pt will negotiate standard ramp and curb step with mod I using LRAD to indicate ability to safely traverse community obstacles.  Target date: 09/05/15   Additional Long Term Goals   Additional Long Term Goals Yes   PT LONG TERM GOAL #6   Title Pt will improve ABC Scale from 0.6% to >14 % to indicate significant improvement in balance confidence. Target date: 09/05/15   PT LONG TERM GOAL #7   Title Pt will negotiate 7 stairs with single rail and mod I to enable to access all levels of home.  Target date: 09/05/15  Pt reports plan to install rails in home prior to moving home from sister's house               Plan - 07/20/15 1256    Clinical Impression Statement Session focused on hip strengthening and gait training with SPC. Pt continues to require frequent rest breaks due to fatigue. Continue per POC.    Pt will benefit from skilled  therapeutic intervention in order to improve on the following deficits Abnormal gait;Cardiopulmonary status limiting activity;Decreased activity tolerance;Decreased balance;Decreased strength;Decreased endurance;Decreased knowledge of use of DME   Rehab Potential Good   PT Frequency 2x / week   PT Duration 8 weeks   PT Treatment/Interventions ADLs/Self Care Home Management;Vestibular;Manual techniques;Therapeutic activities;Therapeutic exercise;Balance training;Neuromuscular re-education;Gait training;Patient/family education;Functional mobility training;Orthotic Fit/Training;Stair training;DME Instruction   PT Next Visit Plan Gait training with SPC. Functional LE strengthening, balance.   Consulted and Agree with Plan of Care Patient        Problem List Patient Active Problem List   Diagnosis Date Noted  . Upper airway cough syndrome  12/22/2013  . OTHER ACUTE SINUSITIS 10/18/2008  . ACUTE BRONCHITIS 10/18/2008  . SLEEP APNEA 01/11/2008  . DIABETES MELLITUS, TYPE II 08/30/2007  . HYPERLIPIDEMIA 08/30/2007  . HYPERTENSION 08/30/2007  . CONGESTIVE HEART FAILURE 08/30/2007  . ALLERGIC RHINITIS 08/30/2007  . OBSTRUCTIVE CHRONIC BRONCHITIS 08/30/2007  . ASTHMA 08/30/2007    Jorje Guild, PT, DPT [facility] [address] [place], [place], [address] Phone: [phone]   Fax:  [phone] 07/20/2015, 12:58 PM

## 2015-07-25 ENCOUNTER — Ambulatory Visit: Payer: BLUE CROSS/BLUE SHIELD | Admitting: Occupational Therapy

## 2015-07-25 ENCOUNTER — Ambulatory Visit: Payer: BLUE CROSS/BLUE SHIELD | Admitting: Physical Therapy

## 2015-07-25 ENCOUNTER — Encounter: Payer: Self-pay | Admitting: Physical Therapy

## 2015-07-25 DIAGNOSIS — R531 Weakness: Secondary | ICD-10-CM

## 2015-07-25 DIAGNOSIS — R269 Unspecified abnormalities of gait and mobility: Secondary | ICD-10-CM | POA: Diagnosis not present

## 2015-07-25 DIAGNOSIS — R279 Unspecified lack of coordination: Principal | ICD-10-CM

## 2015-07-25 DIAGNOSIS — R278 Other lack of coordination: Secondary | ICD-10-CM

## 2015-07-25 DIAGNOSIS — R2681 Unsteadiness on feet: Secondary | ICD-10-CM

## 2015-07-25 NOTE — Therapy (Signed)
[facility] [address] [place], [place], [address] Phone: [phone]   Fax:  [phone]  Physical Therapy Treatment  Patient Details  Name: Craig Wilcox MRN: [id number] Date of Birth: [birth year] Referring Provider:  Rowe Pavy, MD  Encounter Date: 07/25/2015      PT End of Session - 07/25/15 1240    Visit Number 3   Number of Visits 17   Date for PT Re-Evaluation 09/09/15   Authorization Type BCBS   PT Start Time 1233   PT Stop Time 1316   PT Time Calculation (min) 43 min   Activity Tolerance Patient limited by fatigue   Behavior During Therapy [facility] for tasks assessed/performed      Past Medical History  Diagnosis Date  . Chronic kidney disease, stage V requiring chronic dialysis   . CHF (congestive heart failure)     Past Surgical History  Procedure Laterality Date  . Insertion of dialysis catheter      peritoneal  . Cardiac electrophysiology mapping and ablation      There were no vitals filed for this visit.  Visit Diagnosis:  Decreased coordination  Weakness generalized  Abnormality of gait  Unsteadiness  Generalized weakness      Subjective Assessment - 07/25/15 1238    Subjective Sore for a few days after last session, however was able to still move around. HEP going okay, doing some of both the balance and strengthening programs at home. Limited by distance of kitchen counter with balance items. Discussed doing them in hallway or at long table if available. Pt said he could do them in the hallway.                            Currently in Pain? No/denies            [facility] Adult PT Treatment/Exercise - 07/25/15 1241    Transfers   Sit to Stand 5: Supervision;From elevated surface;With upper extremity assist;From bed   Stand to Sit 5: Supervision;With upper extremity assist;To elevated surface;To bed   Ambulation/Gait   Ambulation/Gait Yes   Ambulation/Gait Assistance 4: Min  guard   Ambulation/Gait Assistance Details multimodal cues on posture, equal step lenght, and for sequence with cane/gait.   Ambulation Distance (Feet) 345 Feet  x1, 220 x 1   Assistive device Straight cane   Gait Pattern Step-through pattern;Decreased dorsiflexion - left;Left foot flat;Right foot flat;Trendelenburg;Lateral hip instability;Trunk flexed   Ambulation Surface Level;Indoor     Exercises: At stairs: Foot taps up/down to bottom 3 stairs, x 10 reps each leg with 1 UE support Step ups: with one foot on bottom step, using this leg to lift other one up into air and then lower back down, x 10 reps each side Step downs: one foot on bottom step facing away from stairs, lowering other leg down and then back up, x 10 reps each side.   At mat: sit<stand. No hands to hands on legs toward end. Cues to come up into full upright posture, to scoot forward and for anterior weight shift.  24 inch mat x 10 reps 23 inch mat x 10 reps 22 inch mat x 10 reps        PT Short Term Goals - 07/11/15 2026    PT SHORT TERM GOAL #1   Title Pt will perform initial HEP with mod I using paper handout to maximize functional gains made in physical therapy.  Target date: 08/08/15   PT SHORT TERM GOAL #2   Title Pt will increase 6 Minute Walk Test distance from 692' to 781' to indicate progress toward significant improvement in functional endurance. Target date: 08/08/15   PT SHORT TERM GOAL #3   Title Perform Berg Balance Scale to assess/address functional standing balance. Target date: 08/08/15   PT SHORT TERM GOAL #4   Title Pt will perform sit to stand transfer from standard chair without use of UE's or compensatory strategies to indicate improved functional LE strength. Target date: 08/08/15   PT SHORT TERM GOAL #5   Title --   Additional Short Term Goals   Additional Short Term Goals Yes   PT SHORT TERM GOAL #6   Title --           PT Long Term Goals - 07/11/15 2036    PT LONG TERM GOAL #1    Title Pt will improve Berg score by 8 points from baseline to indicate significant improvement in functional standing balance.  Target date: 09/05/15   PT LONG TERM GOAL #2   Title Pt will increase 6 Minute Walk Test distance from 692' to 869' to indicate significant improvement in functional endurance.  Target date: 09/05/15   PT LONG TERM GOAL #3   Title Pt will independently ambulate 200' over level, indoor surfaces with effective obstacle negotiation to indicate independence with household mobility, progress toward return to PLOF.  Target date: 09/05/15   PT LONG TERM GOAL #4   Title Pt will ambulate x500' over paved, outdoor surfaces with mod I using LRAD to indicate increased safety with community mobility.  Target date: 09/05/15   PT LONG TERM GOAL #5   Title Pt will negotiate standard ramp and curb step with mod I using LRAD to indicate ability to safely traverse community obstacles.  Target date: 09/05/15   Additional Long Term Goals   Additional Long Term Goals Yes   PT LONG TERM GOAL #6   Title Pt will improve ABC Scale from 0.6% to >14 % to indicate significant improvement in balance confidence. Target date: 09/05/15   PT LONG TERM GOAL #7   Title Pt will negotiate 7 stairs with single rail and mod I to enable to access all levels of home.  Target date: 09/05/15  Pt reports plan to install rails in home prior to moving home from sister's house            Plan - 07/25/15 1240    Clinical Impression Statement Continued to focus on gait and strengthening today. No pain, only fatigue reported wtih today's session. Pt making steady progress toward goals.    Pt will benefit from skilled therapeutic intervention in order to improve on the following deficits Abnormal gait;Cardiopulmonary status limiting activity;Decreased activity tolerance;Decreased balance;Decreased strength;Decreased endurance;Decreased knowledge of use of DME   Rehab Potential Good   PT Frequency 2x / week   PT  Duration 8 weeks   PT Treatment/Interventions ADLs/Self Care Home Management;Vestibular;Manual techniques;Therapeutic activities;Therapeutic exercise;Balance training;Neuromuscular re-education;Gait training;Patient/family education;Functional mobility training;Orthotic Fit/Training;Stair training;DME Instruction   PT Next Visit Plan Gait training with SPC. Functional LE strengthening, balance.   Consulted and Agree with Plan of Care Patient        Problem List Patient Active Problem List   Diagnosis Date Noted  . Upper airway cough syndrome 12/22/2013  . OTHER ACUTE SINUSITIS 10/18/2008  . ACUTE BRONCHITIS 10/18/2008  . SLEEP APNEA 01/11/2008  . DIABETES MELLITUS, TYPE II 08/30/2007  .  HYPERLIPIDEMIA 08/30/2007  . HYPERTENSION 08/30/2007  . CONGESTIVE HEART FAILURE 08/30/2007  . ALLERGIC RHINITIS 08/30/2007  . OBSTRUCTIVE CHRONIC BRONCHITIS 08/30/2007  . ASTHMA 08/30/2007    Sallyanne Kuster 07/26/2015, 9:20 AM  Sallyanne Kuster, PTA, [facility] [address] [place], [place] [address] [phone] 07/26/2015, 9:21 AM

## 2015-07-25 NOTE — Patient Instructions (Signed)
   Lie on back holding wand. Raise arms over head. Hold 5sec. Repeat 10 times per set.  Do 2-3 sessions per day.  Perform in seated only to 80-90* shoulder flexion, 2 sets of 10 reps avoid shoulder hike  ROM: Abduction - Wand   Holding wand with left hand palm up, push wand directly out to side, leading with other hand palm down, until stretch is felt. Hold 5 seconds. Repeat 10 times per set. Do 2-3 sessions per day. (Lying down)      Press-Up With Wand   Press wand up until elbows are straight, then reach wand over head to a pain free range. Hold 5 seconds. Repeat 10 times. Do 2-3 sessions per day.   Wearing duckbill splint on right hand bend fingers to perform the"roof" 2 10 reps 3x day

## 2015-07-25 NOTE — Therapy (Signed)
[facility] [address] [place], [place], [address] Phone: [phone]   Fax:  [phone]  Occupational Therapy Treatment  Patient Details  Name: Noel Rodier MRN: [id number] Date of Birth: 1956/02/22 Referring Provider:  Rowe Pavy, MD  Encounter Date: 07/25/2015    Past Medical History  Diagnosis Date  . Chronic kidney disease, stage V requiring chronic dialysis   . CHF (congestive heart failure)     Past Surgical History  Procedure Laterality Date  . Insertion of dialysis catheter      peritoneal  . Cardiac electrophysiology mapping and ablation      There were no vitals filed for this visit.  Visit Diagnosis:  Decreased coordination  Weakness generalized  Abnormality of gait      Subjective Assessment - 07/25/15 1414    Limitations see Epic   Patient Stated Goals to increase strength and coordination   Currently in Pain? No/denies                              OT Education - 07/25/15 1356    Education provided Yes   Education Details cane ex, MP flexion with duckbill splint   Person(s) Educated Patient   Methods Explanation;Demonstration;Handout   Comprehension Verbalized understanding;Returned demonstration          OT Short Term Goals - 07/11/15 1624    OT SHORT TERM GOAL #1   Title I with HEP.   Baseline due 08/09/15   Time 4   Period Weeks   Status New   OT SHORT TERM GOAL #2   Title Pt will demonstrate an 8 lbs improvement in bilateral grip strength for increased functional use.   Time 4   Period Weeks   Status New   OT SHORT TERM GOAL #3   Title Pt will perform light home management/ cooking task in standing with min A.   Time 4   Period Weeks   Status New   OT SHORT TERM GOAL #4   Title Pt will demonstrate ability to retrieve a light weight object  at 120 bilaterally.   Time 4   Period Weeks   Status New   OT SHORT TERM GOAL #5   Title Pt  will increase RUE wrist extension to 55 * for increased functional use.   Time 4   Period Weeks   Status New   Additional Short Term Goals   Additional Short Term Goals Yes   OT SHORT TERM GOAL #6   Title Pt will perform all basic ADLsz modified indpendently.   Time 4   Period Weeks   Status New           OT Long Term Goals - 07/11/15 1634    OT LONG TERM GOAL #1   Title Pt will perform basic home management/ cooking at a walker level modified independently without LOB.   Baseline due 09/08/15   Time 8   Period Weeks   Status New   OT LONG TERM GOAL #2   Title Pt will demonstrate improved fine motor coordination as evidenced by decreasing RUE 9 hole peg test score to 32 secs or less.   Time 8   Period Weeks   Status New   OT LONG TERM GOAL #3   Title Pt will perform simulated work activities modified independently.   Time 8   Period Weeks   Status New   OT  LONG TERM GOAL #4   Title Pt will demonstrate improved functional reach and standing balance as evidenced by perfoming 10 inches bilaterally on standing functional reach test.   Time 8   Period Weeks   Status New               Plan - 07/25/15 1357    Clinical Impression Statement Pt is progressing towards goals for strength and ROM.   Pt will benefit from skilled therapeutic intervention in order to improve on the following deficits (Retired) Abnormal gait;Decreased coordination;Decreased range of motion;Difficulty walking;Decreased safety awareness;Decreased endurance;Decreased activity tolerance;Decreased knowledge of precautions;Pain;Impaired UE functional use;Decreased balance;Decreased mobility;Decreased strength   Rehab Potential Good   OT Frequency 2x / week   OT Duration 8 weeks   OT Treatment/Interventions Self-care/ADL training;Therapeutic exercise;Patient/family education;Splinting;Balance training;Manual Therapy;Neuromuscular education;Energy conservation;Therapeutic exercises;Therapeutic  activities;DME and/or AE instruction;Parrafin;Cryotherapy;Electrical Stimulation;Fluidtherapy;Gait Training;Moist Heat;Contrast Bath;Passive range of motion   Plan reinforce cane exercises, UE functional use   Consulted and Agree with Plan of Care Patient        Problem List Patient Active Problem List   Diagnosis Date Noted  . Upper airway cough syndrome 12/22/2013  . OTHER ACUTE SINUSITIS 10/18/2008  . ACUTE BRONCHITIS 10/18/2008  . SLEEP APNEA 01/11/2008  . DIABETES MELLITUS, TYPE II 08/30/2007  . HYPERLIPIDEMIA 08/30/2007  . HYPERTENSION 08/30/2007  . CONGESTIVE HEART FAILURE 08/30/2007  . ALLERGIC RHINITIS 08/30/2007  . OBSTRUCTIVE CHRONIC BRONCHITIS 08/30/2007  . ASTHMA 08/30/2007    RINE,KATHRYN 07/25/2015, 2:14 PM  [facility] [address] [place], [place], [address] Phone: [phone]   Fax:  [phone]

## 2015-07-25 NOTE — Therapy (Addendum)
[facility] [address] [place], [place], [address] Phone: [phone]   Fax:  [phone]  Occupational Therapy Treatment  Patient Details  Name: Craig Wilcox MRN: [id number] Date of Birth: 03-Nov-1955 Referring Stephan Draughn:  Rowe Pavy, MD  Encounter Date: 07/25/2015    Past Medical History  Diagnosis Date  . Chronic kidney disease, stage V requiring chronic dialysis   . CHF (congestive heart failure)     Past Surgical History  Procedure Laterality Date  . Insertion of dialysis catheter      peritoneal  . Cardiac electrophysiology mapping and ablation      There were no vitals filed for this visit.  Visit Diagnosis:  Decreased coordination  Weakness generalized  Abnormality of gait      Treatment:Pt was instructed in cane exercises for increased shoulder A/ROM, see pt instructions and MP flexion for RUE with splint due to clawing.(therapist fabricated exercise splint to prevent PIP flexion when performing MP flexion.) Arm bike x 6 mins level 4 for conditioning.                       OT Education - 07/25/15 1356    Education provided Yes   Education Details cane ex, MP flexion with duckbill splint   Person(s) Educated Patient   Methods Explanation;Demonstration;Handout   Comprehension Verbalized understanding;Returned demonstration          OT Short Term Goals - 07/11/15 1624    OT SHORT TERM GOAL #1   Title I with HEP.   Baseline due 08/09/15   Time 4   Period Weeks   Status New   OT SHORT TERM GOAL #2   Title Pt will demonstrate an 8 lbs improvement in bilateral grip strength for increased functional use.   Time 4   Period Weeks   Status New   OT SHORT TERM GOAL #3   Title Pt will perform light home management/ cooking task in standing with min A.   Time 4   Period Weeks   Status New   OT SHORT TERM GOAL #4   Title Pt will demonstrate ability to retrieve a light  weight object  at 120 bilaterally.   Time 4   Period Weeks   Status New   OT SHORT TERM GOAL #5   Title Pt will increase RUE wrist extension to 55 * for increased functional use.   Time 4   Period Weeks   Status New   Additional Short Term Goals   Additional Short Term Goals Yes   OT SHORT TERM GOAL #6   Title Pt will perform all basic ADLsz modified indpendently.   Time 4   Period Weeks   Status New           OT Long Term Goals - 07/11/15 1634    OT LONG TERM GOAL #1   Title Pt will perform basic home management/ cooking at a walker level modified independently without LOB.   Baseline due 09/08/15   Time 8   Period Weeks   Status New   OT LONG TERM GOAL #2   Title Pt will demonstrate improved fine motor coordination as evidenced by decreasing RUE 9 hole peg test score to 32 secs or less.   Time 8   Period Weeks   Status New   OT LONG TERM GOAL #3   Title Pt will perform simulated work activities modified independently.   Time 8  Period Weeks   Status New   OT LONG TERM GOAL #4   Title Pt will demonstrate improved functional reach and standing balance as evidenced by perfoming 10 inches bilaterally on standing functional reach test.   Time 8   Period Weeks   Status New               Plan - 07/25/15 1357    Clinical Impression Statement Pt is progressing towards goals for strength and ROM.   Pt will benefit from skilled therapeutic intervention in order to improve on the following deficits (Retired) Abnormal gait;Decreased coordination;Decreased range of motion;Difficulty walking;Decreased safety awareness;Decreased endurance;Decreased activity tolerance;Decreased knowledge of precautions;Pain;Impaired UE functional use;Decreased balance;Decreased mobility;Decreased strength   Rehab Potential Good   OT Frequency 2x / week   OT Duration 8 weeks   OT Treatment/Interventions Self-care/ADL training;Therapeutic exercise;Patient/family education;Splinting;Balance  training;Manual Therapy;Neuromuscular education;Energy conservation;Therapeutic exercises;Therapeutic activities;DME and/or AE instruction;Parrafin;Cryotherapy;Electrical Stimulation;Fluidtherapy;Gait Training;Moist Heat;Contrast Bath;Passive range of motion   Plan reinforce cane exercises, UE functional use   Consulted and Agree with Plan of Care Patient        Problem List Patient Active Problem List   Diagnosis Date Noted  . Upper airway cough syndrome 12/22/2013  . OTHER ACUTE SINUSITIS 10/18/2008  . ACUTE BRONCHITIS 10/18/2008  . SLEEP APNEA 01/11/2008  . DIABETES MELLITUS, TYPE II 08/30/2007  . HYPERLIPIDEMIA 08/30/2007  . HYPERTENSION 08/30/2007  . CONGESTIVE HEART FAILURE 08/30/2007  . ALLERGIC RHINITIS 08/30/2007  . OBSTRUCTIVE CHRONIC BRONCHITIS 08/30/2007  . ASTHMA 08/30/2007    RINE,KATHRYN 07/25/2015, 2:13 PM Keene Breath, OTR/L [phone] Phone: [phone] 2:13 PM 07/25/2015 [facility] Rehabilitation [facility] [address] [place], [place], [address] Phone: [phone]   Fax:  [phone]

## 2015-07-27 ENCOUNTER — Ambulatory Visit: Payer: BLUE CROSS/BLUE SHIELD | Admitting: Occupational Therapy

## 2015-07-27 ENCOUNTER — Ambulatory Visit: Payer: BLUE CROSS/BLUE SHIELD | Admitting: Physical Therapy

## 2015-07-31 ENCOUNTER — Ambulatory Visit: Payer: BLUE CROSS/BLUE SHIELD | Admitting: Occupational Therapy

## 2015-07-31 ENCOUNTER — Encounter: Payer: Self-pay | Admitting: Occupational Therapy

## 2015-07-31 ENCOUNTER — Ambulatory Visit: Payer: BLUE CROSS/BLUE SHIELD | Attending: Physical Medicine and Rehabilitation | Admitting: Physical Therapy

## 2015-07-31 DIAGNOSIS — R2681 Unsteadiness on feet: Secondary | ICD-10-CM | POA: Diagnosis present

## 2015-07-31 DIAGNOSIS — R278 Other lack of coordination: Secondary | ICD-10-CM

## 2015-07-31 DIAGNOSIS — R531 Weakness: Secondary | ICD-10-CM | POA: Diagnosis not present

## 2015-07-31 DIAGNOSIS — R279 Unspecified lack of coordination: Secondary | ICD-10-CM

## 2015-07-31 DIAGNOSIS — R269 Unspecified abnormalities of gait and mobility: Secondary | ICD-10-CM | POA: Insufficient documentation

## 2015-07-31 NOTE — Therapy (Signed)
[facility] [address] [place], [place], [address] Phone: [phone]   Fax:  [phone]  Physical Therapy Treatment  Patient Details  Name: Craig Wilcox MRN: [id number] Date of Birth: 21-Feb-1956 Referring Provider:  Rowe Pavy, MD  Encounter Date: 07/31/2015      PT End of Session - 07/31/15 1433    Visit Number 4   Number of Visits 17   Date for PT Re-Evaluation 09/09/15   Authorization Type BCBS   PT Start Time 1100   PT Stop Time 1146   PT Time Calculation (min) 46 min   Equipment Utilized During Treatment Gait belt   Activity Tolerance Patient limited by fatigue   Behavior During Therapy [facility] for tasks assessed/performed      Past Medical History  Diagnosis Date  . Chronic kidney disease, stage V requiring chronic dialysis (HCC)   . CHF (congestive heart failure) [facility])     Past Surgical History  Procedure Laterality Date  . Insertion of dialysis catheter      peritoneal  . Cardiac electrophysiology mapping and ablation      There were no vitals filed for this visit.  Visit Diagnosis:  Weakness generalized  Abnormality of gait  Unsteadiness  Generalized weakness      Subjective Assessment - 07/31/15 1105    Subjective Pt reporting no pain, no falls. Reports he walked around [employer] without stopping. Reports having done some walking short distances in home without walker. Pt not agreeable ot transitioning from walking with RW to [facility], stating, "I'll probably just start walking without the walker altogether."   Pertinent History Goes by "[name]". PMH: CKD, ESRD (performs peritoneal dialysis at home), CHF, DM II, COPD, HLD, gout, OSA, s/p osteomyelitis of L foot   Patient Stated Goals "Get back to work as soon as I can, get my endurance up, and be able to get up and walk around better."   Currently in Pain? No/denies            [facility] PT Assessment - 07/31/15 1426    Strength   Right  Ankle Dorsiflexion 3-/5   Left Ankle Dorsiflexion 3-/5                     [facility] Adult PT Treatment/Exercise - 07/31/15 1426    Transfers   Sit to Stand 5: Supervision;Without upper extremity assist   Sit to Stand Details Tactile cues for weight shifting   Sit to Stand Details (indicate cue type and reason) Cueing for narrow BOS (due to pt tendency toward wide BOS)   Stand to Sit 5: Supervision;Without upper extremity assist   Ambulation/Gait   Ambulation/Gait Yes   Ambulation/Gait Assistance 6: Modified independent (Device/Increase time);4: Min guard;4: Min assist   Ambulation/Gait Assistance Details Mod I with RW 2 x150' indoors; min guard without AD x230 indoors, x130' over outdoor, paved surfaces with min guard-min A due to fatigue   Ambulation Distance (Feet) 175 Feet  890' total; longest trial consecutively = 230'   Assistive device Rolling walker;None   Gait Pattern Step-through pattern;Decreased dorsiflexion - left;Left foot flat;Right foot flat;Trendelenburg;Lateral hip instability;Trunk flexed;Decreased dorsiflexion - right;Decreased arm swing - right;Decreased arm swing - left;Decreased step length - right;Decreased step length - left;Decreased hip/knee flexion - right;Decreased hip/knee flexion - left;Right flexed knee in stance;Decreased trunk rotation   Ambulation Surface Level;Unlevel;Indoor;Outdoor;Paved   Gait Comments During gait without AD, noted more prominent limitation of hip flexion of respective LE  during LE advancement. Also noted decreased B step length (on R > L) which appeared to be secondary to decreased R HS extensibility. Pt c/o fatigue in hip flexors (R  > L) when ambulating >100' without AD.   Exercises   Exercises Other Exercises   Other Exercises  With instruction, demonstration, and cueing  from this PT, pt effectiively perform B ankle dorsiflexion AAROM using sheet, hip internal rotation self-stretch, and hamstring stretch. See Pt Instructions  for details on all exercises, reps/sets, frequency/duration. Pt also performed B gastrocnemius/soleus self-stretch in standing 3 x 30-second holds per side.                PT Education - 07/31/15 1425    Education provided Yes   Education Details Added stretches and ankle DF AAROM to HEP; see Pt Instructions. Recommending pt ambulate without RW only down narrow hallway (< 25') at home.   Person(s) Educated Patient   Methods Explanation;Demonstration;Tactile cues;Verbal cues;Handout   Comprehension Verbalized understanding;Returned demonstration          PT Short Term Goals - 07/11/15 2026    PT SHORT TERM GOAL #1   Title Pt will perform initial HEP with mod I using paper handout to maximize functional gains made in physical therapy. Target date: 08/08/15   PT SHORT TERM GOAL #2   Title Pt will increase 6 Minute Walk Test distance from 692' to 781' to indicate progress toward significant improvement in functional endurance. Target date: 08/08/15   PT SHORT TERM GOAL #3   Title Perform Berg Balance Scale to assess/address functional standing balance. Target date: 08/08/15   PT SHORT TERM GOAL #4   Title Pt will perform sit to stand transfer from standard chair without use of UE's or compensatory strategies to indicate improved functional LE strength. Target date: 08/08/15   PT SHORT TERM GOAL #5   Title --   Additional Short Term Goals   Additional Short Term Goals Yes   PT SHORT TERM GOAL #6   Title --           PT Long Term Goals - 07/11/15 2036    PT LONG TERM GOAL #1   Title Pt will improve Berg score by 8 points from baseline to indicate significant improvement in functional standing balance.  Target date: 09/05/15   PT LONG TERM GOAL #2   Title Pt will increase 6 Minute Walk Test distance from 692' to 869' to indicate significant improvement in functional endurance.  Target date: 09/05/15   PT LONG TERM GOAL #3   Title Pt will independently ambulate 200' over  level, indoor surfaces with effective obstacle negotiation to indicate independence with household mobility, progress toward return to PLOF.  Target date: 09/05/15   PT LONG TERM GOAL #4   Title Pt will ambulate x500' over paved, outdoor surfaces with mod I using LRAD to indicate increased safety with community mobility.  Target date: 09/05/15   PT LONG TERM GOAL #5   Title Pt will negotiate standard ramp and curb step with mod I using LRAD to indicate ability to safely traverse community obstacles.  Target date: 09/05/15   Additional Long Term Goals   Additional Long Term Goals Yes   PT LONG TERM GOAL #6   Title Pt will improve ABC Scale from 0.6% to >14 % to indicate significant improvement in balance confidence. Target date: 09/05/15   PT LONG TERM GOAL #7   Title Pt will negotiate 7 stairs with single rail  and mod I to enable to access all levels of home.  Target date: 09/05/15  Pt reports plan to install rails in home prior to moving home from sister's house               Plan - 07/31/15 1437    Clinical Impression Statement Session focused on gait training, increasing LE muscle extensiblity/ankle strength to promote more normalized gait pattern. Tolerance to gait training without AD limited by LE fatigue (R > L). Continue per POC.   Pt will benefit from skilled therapeutic intervention in order to improve on the following deficits Abnormal gait;Cardiopulmonary status limiting activity;Decreased activity tolerance;Decreased balance;Decreased strength;Decreased endurance;Decreased knowledge of use of DME   Rehab Potential Good   PT Frequency 2x / week   PT Duration 8 weeks   PT Treatment/Interventions ADLs/Self Care Home Management;Vestibular;Manual techniques;Therapeutic activities;Therapeutic exercise;Balance training;Neuromuscular re-education;Gait training;Patient/family education;Functional mobility training;Orthotic Fit/Training;Stair training;DME Instruction   PT Next Visit Plan  Gait training without AD. Functional LE strengthening, balance.   Consulted and Agree with Plan of Care Patient        Problem List Patient Active Problem List   Diagnosis Date Noted  . Upper airway cough syndrome 12/22/2013  . OTHER ACUTE SINUSITIS 10/18/2008  . ACUTE BRONCHITIS 10/18/2008  . SLEEP APNEA 01/11/2008  . DIABETES MELLITUS, TYPE II 08/30/2007  . HYPERLIPIDEMIA 08/30/2007  . HYPERTENSION 08/30/2007  . CONGESTIVE HEART FAILURE 08/30/2007  . ALLERGIC RHINITIS 08/30/2007  . OBSTRUCTIVE CHRONIC BRONCHITIS 08/30/2007  . ASTHMA 08/30/2007    Jorje Guild, PT, DPT [facility] [address] [place], [place], [address] Phone: [phone]   Fax:  [phone] 07/31/2015, 2:46 PM

## 2015-07-31 NOTE — Therapy (Signed)
[facility] [address] [place], [place], [address] Phone: [phone]   Fax:  [phone]  Occupational Therapy Treatment  Patient Details  Name: Craig Wilcox MRN: [id number] Date of Birth: [birth year] Referring Provider:  Rowe Pavy, MD  Encounter Date: 07/31/2015      OT End of Session - 07/31/15 1227    Visit Number 4   Number of Visits 17   Date for OT Re-Evaluation 09/08/15   Authorization Type BCBS   OT Start Time 1151   OT Stop Time 1229   OT Time Calculation (min) 38 min   Activity Tolerance Patient tolerated treatment well   Behavior During Therapy [facility] for tasks assessed/performed      Past Medical History  Diagnosis Date  . Chronic kidney disease, stage V requiring chronic dialysis (HCC)   . [facility] ([facility]     Past Surgical History  Procedure Laterality Date  . Insertion of dialysis catheter      peritoneal  . Cardiac electrophysiology mapping and ablation      There were no vitals filed for this visit.  Visit Diagnosis:  Weakness generalized  Decreased coordination      Subjective Assessment - 07/31/15 1207    Subjective  Pt reports that he would have been able to use chop sticks before, but can't now.   Limitations see Epic   Patient Stated Goals to increase strength and coordination   Currently in Pain? No/denies                      OT Treatments/Exercises (OP) - 07/31/15 0001    Exercises   Exercises Hand   Hand Exercises   Hand Gripper with Medium Beads Picking up blocks using 25lbs sustained grip strength for incr strength.  (1/2 with L hand and 1/2 with R hand) with min-mod difficulty   Fine Motor Coordination   Fine Motor Coordination Flipping cards   Flipping cards with R hand with focus on finger extension and using 5th digit with min difficulty.  Pushing cards off top of deck with min difficulty using R hand for improved finger  extension, particularly digits 4-5.   Functional Reaching Activities   Mid Level to place small pegs in vertical pegboard with each UE for incr coordination and activity tolerance with min difficulty.     Arm bike x69min level 4 (forward/backwards) for conditioning without rest.            OT Education - 07/31/15 1209    Education Details reviewed cand ex and MP flex (with therapist assisting)   Person(s) Educated Patient   Methods Explanation;Demonstration   Comprehension Verbalized understanding;Returned demonstration          OT Short Term Goals - 07/11/15 1624    OT SHORT TERM GOAL #1   Title I with HEP.   Baseline due 08/09/15   Time 4   Period Weeks   Status New   OT SHORT TERM GOAL #2   Title Pt will demonstrate an 8 lbs improvement in bilateral grip strength for increased functional use.   Time 4   Period Weeks   Status New   OT SHORT TERM GOAL #3   Title Pt will perform light home management/ cooking task in standing with min A.   Time 4   Period Weeks   Status New   OT SHORT TERM GOAL #4   Title Pt will demonstrate ability to retrieve  a light weight object  at 120 bilaterally.   Time 4   Period Weeks   Status New   OT SHORT TERM GOAL #5   Title Pt will increase RUE wrist extension to 55 * for increased functional use.   Time 4   Period Weeks   Status New   Additional Short Term Goals   Additional Short Term Goals Yes   OT SHORT TERM GOAL #6   Title Pt will perform all basic ADLsz modified indpendently.   Time 4   Period Weeks   Status New           OT Long Term Goals - 07/11/15 1634    OT LONG TERM GOAL #1   Title Pt will perform basic home management/ cooking at a walker level modified independently without LOB.   Baseline due 09/08/15   Time 8   Period Weeks   Status New   OT LONG TERM GOAL #2   Title Pt will demonstrate improved fine motor coordination as evidenced by decreasing RUE 9 hole peg test score to 32 secs or less.   Time 8    Period Weeks   Status New   OT LONG TERM GOAL #3   Title Pt will perform simulated work activities modified independently.   Time 8   Period Weeks   Status New   OT LONG TERM GOAL #4   Title Pt will demonstrate improved functional reach and standing balance as evidenced by perfoming 10 inches bilaterally on standing functional reach test.   Time 8   Period Weeks   Status New               Plan - 07/31/15 1240    Clinical Impression Statement Pt continues to progress towards goals for strength and coordination.   Plan UE functional use, coordination, UE strength/ROM   Consulted and Agree with Plan of Care Patient        Problem List Patient Active Problem List   Diagnosis Date Noted  . Upper airway cough syndrome 12/22/2013  . OTHER ACUTE SINUSITIS 10/18/2008  . ACUTE BRONCHITIS 10/18/2008  . SLEEP APNEA 01/11/2008  . DIABETES MELLITUS, TYPE II 08/30/2007  . HYPERLIPIDEMIA 08/30/2007  . HYPERTENSION 08/30/2007  . CONGESTIVE HEART FAILURE 08/30/2007  . ALLERGIC RHINITIS 08/30/2007  . OBSTRUCTIVE CHRONIC BRONCHITIS 08/30/2007  . ASTHMA 08/30/2007    [facility] 07/31/2015, 12:41 PM  [facility] [address] [place], [place], [address] Phone: [phone]   Fax:  [phone]   Willa Frater, OTR/L 07/31/2015 12:41 PM

## 2015-07-31 NOTE — Patient Instructions (Addendum)
TOES RAISES - DORSIFLEXION - BOTH Start with your feet on the ground. Loop a long sheet around the top of one foot. Use your ankle muscles to bend your ankle upward (toes toward the ceiling). Do as much as you can using your muscles, then use the sheet to help your ankle move through the rest of the range. SLOWLY lower back to resting position. Perform 20 reps on each side. Do this 2-3 times per day.    SEATED HAMSTRING STRETCH While seated, rest your heel on the floor with your knee straight and gently lean forward until a GENTLE stretch is felt behind you knee/thigh. Hold for 30 seconds. Relax. Repeat 4 times on each side.    HIP INTERNAL ROTATION STRETCH - SEATED Start by sitting on a chair with your legs spread apart and feet planted on the ground. Next, use your hand to draw your knee inward as shown. Hold for 30 seconds. Do this 4 times on each Leg.

## 2015-08-03 ENCOUNTER — Ambulatory Visit: Payer: BLUE CROSS/BLUE SHIELD | Admitting: Physical Therapy

## 2015-08-03 ENCOUNTER — Ambulatory Visit: Payer: BLUE CROSS/BLUE SHIELD | Admitting: Occupational Therapy

## 2015-08-03 VITALS — BP 152/92 | HR 114

## 2015-08-03 DIAGNOSIS — R531 Weakness: Secondary | ICD-10-CM

## 2015-08-03 DIAGNOSIS — R269 Unspecified abnormalities of gait and mobility: Secondary | ICD-10-CM

## 2015-08-03 DIAGNOSIS — R2681 Unsteadiness on feet: Secondary | ICD-10-CM

## 2015-08-03 DIAGNOSIS — R279 Unspecified lack of coordination: Secondary | ICD-10-CM

## 2015-08-03 DIAGNOSIS — R278 Other lack of coordination: Secondary | ICD-10-CM

## 2015-08-03 NOTE — Therapy (Signed)
[facility] [address] [place], [place], [address] Phone: [phone]   Fax:  [phone]  Occupational Therapy Treatment  Patient Details  Name: Craig Wilcox MRN: [id number] Date of Birth: 07/02/1956 Referring Provider:  Rowe Pavy, MD  Encounter Date: 08/03/2015      OT End of Session - 08/03/15 1029    Visit Number 5   Number of Visits 17   Date for OT Re-Evaluation 09/08/15   Authorization Type BCBS   OT Start Time 1025   OT Stop Time 1100   OT Time Calculation (min) 35 min      Past Medical History  Diagnosis Date  . Chronic kidney disease, stage V requiring chronic dialysis (HCC)   . CHF (congestive heart failure) [facility])     Past Surgical History  Procedure Laterality Date  . Insertion of dialysis catheter      peritoneal  . Cardiac electrophysiology mapping and ablation      There were no vitals filed for this visit.  Visit Diagnosis:  Weakness generalized  Decreased coordination      Subjective Assessment - 08/03/15 1024    Limitations see Epic   Patient Stated Goals to increase strength and coordination   Currently in Pain? Yes   Pain Location Leg   Pain Orientation Left;Right   Pain Type Acute pain   Aggravating Factors  standing   Pain Relieving Factors rest     Treatment: supine cane exercises for shoulder flexion and chest press, 20 reps each followed by strengthening in supine, chest press 2 sets of 10 reps with 5 lbs in each hand,  shoulder flexion 2 sets of 10 reps with 3 lbs weight. Seated biceps curls 2 sets of 10 reps with 5 lbs weight  Arm bike x 8 mins level 5 for conditioning.  Standing to copy small peg design for increased fine motor coordination, min difficulty/ drops.                            OT Short Term Goals - 07/11/15 1624    OT SHORT TERM GOAL #1   Title I with HEP.   Baseline due 08/09/15   Time 4   Period Weeks   Status  New   OT SHORT TERM GOAL #2   Title Pt will demonstrate an 8 lbs improvement in bilateral grip strength for increased functional use.   Time 4   Period Weeks   Status New   OT SHORT TERM GOAL #3   Title Pt will perform light home management/ cooking task in standing with min A.   Time 4   Period Weeks   Status New   OT SHORT TERM GOAL #4   Title Pt will demonstrate ability to retrieve a light weight object  at 120 bilaterally.   Time 4   Period Weeks   Status New   OT SHORT TERM GOAL #5   Title Pt will increase RUE wrist extension to 55 * for increased functional use.   Time 4   Period Weeks   Status New   Additional Short Term Goals   Additional Short Term Goals Yes   OT SHORT TERM GOAL #6   Title Pt will perform all basic ADLsz modified indpendently.   Time 4   Period Weeks   Status New           OT Long Term Goals - 07/11/15 6962  OT LONG TERM GOAL #1   Title Pt will perform basic home management/ cooking at a walker level modified independently without LOB.   Baseline due 09/08/15   Time 8   Period Weeks   Status New   OT LONG TERM GOAL #2   Title Pt will demonstrate improved fine motor coordination as evidenced by decreasing RUE 9 hole peg test score to 32 secs or less.   Time 8   Period Weeks   Status New   OT LONG TERM GOAL #3   Title Pt will perform simulated work activities modified independently.   Time 8   Period Weeks   Status New   OT LONG TERM GOAL #4   Title Pt will demonstrate improved functional reach and standing balance as evidenced by perfoming 10 inches bilaterally on standing functional reach test.   Time 8   Period Weeks   Status New               Plan - 08/03/15 1026    Clinical Impression Statement Pt is progressing towards goals for UE strength and coordination.   Pt will benefit from skilled therapeutic intervention in order to improve on the following deficits (Retired) Abnormal gait;Decreased coordination;Decreased  range of motion;Difficulty walking;Decreased safety awareness;Decreased endurance;Decreased activity tolerance;Decreased knowledge of precautions;Pain;Impaired UE functional use;Decreased balance;Decreased mobility;Decreased strength   Rehab Potential Good   OT Frequency 2x / week   OT Duration 8 weeks   OT Treatment/Interventions Self-care/ADL training;Therapeutic exercise;Patient/family education;Splinting;Balance training;Manual Therapy;Neuromuscular education;Energy conservation;Therapeutic exercises;Therapeutic activities;DME and/or AE instruction;Parrafin;Cryotherapy;Electrical Stimulation;Fluidtherapy;Gait Training;Moist Heat;Contrast Bath;Passive range of motion   Plan UE strength and ROM, coordiantion   Consulted and Agree with Plan of Care Patient        Problem List Patient Active Problem List   Diagnosis Date Noted  . Upper airway cough syndrome 12/22/2013  . OTHER ACUTE SINUSITIS 10/18/2008  . ACUTE BRONCHITIS 10/18/2008  . SLEEP APNEA 01/11/2008  . DIABETES MELLITUS, TYPE II 08/30/2007  . HYPERLIPIDEMIA 08/30/2007  . HYPERTENSION 08/30/2007  . CONGESTIVE HEART FAILURE 08/30/2007  . ALLERGIC RHINITIS 08/30/2007  . OBSTRUCTIVE CHRONIC BRONCHITIS 08/30/2007  . ASTHMA 08/30/2007    RINE,KATHRYN 08/03/2015, 1:12 PM Keene Breath, OTR/L [phone] Phone: [phone] 1:12 PM 08/03/2015 [facility] [address] [place], [place], [address] Phone: [phone]   Fax:  [phone]

## 2015-08-03 NOTE — Therapy (Signed)
[facility] [address] [place], [place], [address] Phone: [phone]   Fax:  [phone]  Physical Therapy Treatment  Patient Details  Name: Craig Wilcox MRN: [id number] Date of Birth: Nov 13, 1955 Referring Provider:  Rowe Pavy, MD  Encounter Date: 08/03/2015      PT End of Session - 08/03/15 1238    Visit Number 5   Number of Visits 17   Date for PT Re-Evaluation 09/09/15   Authorization Type BCBS   PT Start Time 1102   PT Stop Time 1146   PT Time Calculation (min) 44 min   Equipment Utilized During Treatment Gait belt   Activity Tolerance Patient tolerated treatment well   Behavior During Therapy [facility] for tasks assessed/performed      Past Medical History  Diagnosis Date  . Chronic kidney disease, stage V requiring chronic dialysis (HCC)   . CHF (congestive heart failure) [facility])     Past Surgical History  Procedure Laterality Date  . Insertion of dialysis catheter      peritoneal  . Cardiac electrophysiology mapping and ablation      Filed Vitals:   08/03/15 1126  BP: 152/92  Pulse: 114  SpO2: 98%    Visit Diagnosis:  Abnormality of gait  Unsteadiness  Generalized weakness      Subjective Assessment - 08/03/15 1106    Subjective Pt denies falls. Reports he has been working daily on movement in both ankles.   Pertinent History Goes by [name]". PMH: CKD, ESRD (performs peritoneal dialysis at home), CHF, DM II, COPD, HLD, gout, OSA, s/p osteomyelitis of L foot   Patient Stated Goals "Get back to work as soon as I can, get my endurance up, and be able to get up and walk around better."   Currently in Pain? No/denies            [facility] PT Assessment - 08/03/15 0001    6 Minute walk- Post Test   6 Minute Walk Post Test yes   BP (mmHg) (!) 152/92 mmHg   HR (bpm) 114   02 Sat (%RA) 98 %   6 minute walk test results    Aerobic Endurance Distance Walked 979   Endurance additional  comments using RW. No rest breaks,.                     [facility] Adult PT Treatment/Exercise - 08/03/15 0001    Transfers   Sit to Stand 5: Supervision;Without upper extremity assist   Sit to Stand Details Tactile cues for weight shifting   Stand to Sit 5: Supervision;Without upper extremity assist   Ambulation/Gait   Ambulation/Gait Yes   Ambulation/Gait Assistance 6: Modified independent (Device/Increase time);4: Min guard;4: Min assist   Ambulation Distance (Feet) 979 Feet    Assistive device Rolling walker;None   Gait Pattern Step-through pattern;Decreased dorsiflexion - left;Left foot flat;Right foot flat;Trendelenburg;Lateral hip instability;Trunk flexed;Decreased dorsiflexion - right;Decreased hip/knee flexion - right;Decreased hip/knee flexion - left;Decreased trunk rotation;Left flexed knee in stance   Ambulation Surface Level;Indoor   Gait Comments --   Exercises   Exercises Other Exercises   Other Exercises  Began reviewing/progressing HEP. See Pt Instructions for details on exercises reviewed (all of which pt performed with mod I using paper handout). Progressed clamshells by adding green Tband for resistance, decreased reps to 15.                 PT Education - 08/03/15 1228  Education provided Yes   Education Details progress, functional implications. Progressed HEP.   Person(s) Educated Patient   Methods Explanation   Comprehension Verbalized understanding          PT Short Term Goals - 08/03/15 1108    PT SHORT TERM GOAL #1   Title Pt will perform initial HEP with mod I using paper handout to maximize functional gains made in physical therapy. Target date: 08/08/15   PT SHORT TERM GOAL #2   Title Pt will increase 6 Minute Walk Test distance from 692' to 781' to indicate progress toward significant improvement in functional endurance. Target date: 08/08/15   Baseline 10/7: = 979'   Status Achieved   PT SHORT TERM GOAL #3    Title Perform Berg Balance Scale to assess/address functional standing balance. Target date: 08/08/15   Baseline Per documentation on 9/20, baseline Berg score = 40/56.   Status Achieved   PT SHORT TERM GOAL #4   Title Pt will perform sit to stand transfer from standard chair without use of UE's or compensatory strategies to indicate improved functional LE strength. Target date: 08/08/15           PT Long Term Goals - 08/03/15 1130    PT LONG TERM GOAL #1   Title Pt will improve Berg score by 8 points from baseline to indicate significant improvement in functional standing balance.  Target date: 09/05/15   Baseline 9/20: Baseline Berg score = 40/56   PT LONG TERM GOAL #2   Title Pt will increase 6 Minute Walk Test distance from 692' to 869' to indicate significant improvement in functional endurance.  Target date: 09/05/15   Baseline 10/7: = 979'   Status Achieved   PT LONG TERM GOAL #3   Title Pt will independently ambulate 200' over level, indoor surfaces with effective obstacle negotiation to indicate independence with household mobility, progress toward return to PLOF.  Target date: 09/05/15   PT LONG TERM GOAL #4   Title Pt will ambulate x500' over paved, outdoor surfaces with mod I using LRAD to indicate increased safety with community mobility.  Target date: 09/05/15   PT LONG TERM GOAL #5   Title Pt will negotiate standard ramp and curb step with mod I using LRAD to indicate ability to safely traverse community obstacles.  Target date: 09/05/15   PT LONG TERM GOAL #6   Title Pt will improve ABC Scale from 0.6% to >14 % to indicate significant improvement in balance confidence. Target date: 09/05/15   PT LONG TERM GOAL #7   Title Pt will negotiate 7 stairs with single rail and mod I to enable to access all levels of home.  Target date: 09/05/15  Pt reports plan to install rails in home prior to moving home from sister's house               Plan - 08/03/15 1239     Clinical Impression Statement Pt surpassed both short and long term goals for , suggesting significant improvement in functional endurance. Began reviewing/progressing HEP but did not complete due to time constraint. Continue per POC.   Pt will benefit from skilled therapeutic intervention in order to improve on the following deficits Abnormal gait;Cardiopulmonary status limiting activity;Decreased activity tolerance;Decreased balance;Decreased strength;Decreased endurance;Decreased knowledge of use of DME   Rehab Potential Good   PT Frequency 2x / week   PT Duration 8 weeks   PT Treatment/Interventions ADLs/Self Care Home Management;Vestibular;Manual techniques;Therapeutic activities;Therapeutic  exercise;Balance training;Neuromuscular re-education;Gait training;Patient/family education;Functional mobility training;Orthotic Fit/Training;Stair training;DME Instruction   PT Next Visit Plan Finish reviewing HEP to address STG 1 (Pt planning to bring entire HEP to session). Check STG 4. Trial SPC.   Consulted and Agree with Plan of Care Patient        Problem List Patient Active Problem List   Diagnosis Date Noted  . Upper airway cough syndrome 12/22/2013  . OTHER ACUTE SINUSITIS 10/18/2008  . ACUTE BRONCHITIS 10/18/2008  . SLEEP APNEA 01/11/2008  . DIABETES MELLITUS, TYPE II 08/30/2007  . HYPERLIPIDEMIA 08/30/2007  . HYPERTENSION 08/30/2007  . CONGESTIVE HEART FAILURE 08/30/2007  . ALLERGIC RHINITIS 08/30/2007  . OBSTRUCTIVE CHRONIC BRONCHITIS 08/30/2007  . ASTHMA 08/30/2007    Jorje Guild, PT, DPT [facility] [address] [place], [place], [address] Phone: [phone]   Fax:  [phone] 08/03/2015, 12:46 PM

## 2015-08-03 NOTE — Patient Instructions (Addendum)
TOES RAISES - DORSIFLEXION - BOTH Start with your feet on the ground. Loop a long sheet around the top of one foot. Use your ankle muscles to bend your ankle upward (toes toward the ceiling). Do as much as you can using your muscles, then use the sheet to help your ankle move through the rest of the range. SLOWLY lower back to resting position. Perform 20 reps on each side. Do this 2-3 times per day.    SEATED HAMSTRING STRETCH While seated, rest your heel on the floor with your knee straight and gently lean forward (hinge at your waist) until a GENTLE stretch is felt behind you knee/thigh. Hold for 30 seconds. Relax. Repeat 4 times on each side.    HIP INTERNAL ROTATION STRETCH - SEATED Start by sitting on a chair with your legs spread apart and feet planted on the ground. Next, use your hand to draw your knee inward as shown. Hold for 30 seconds. Do this 4 times on each Leg.    Functional Quadriceps: Sit to Stand   Sit on the edge of your high bed with your feet on the ground. Make sure feet aren't too far apart. Stand up without using your hands. Then, slowly sit down (without plopping). Do 10 reps. Performed 3-4 sets per day, every day.  Abduction: Clam (Eccentric) - Side-Lying   Lie on side with knees bent. Loop dark green resistance band around your knees. Lift top knee, keeping feet together. Keep trunk steady. You should feel this in you "back pocket" muscle; if you don't, you're doing it wrong. Slowly lower for 3-5 seconds. 15 reps, 3 times per day.

## 2015-08-07 ENCOUNTER — Ambulatory Visit: Payer: BLUE CROSS/BLUE SHIELD | Admitting: Physical Therapy

## 2015-08-07 ENCOUNTER — Ambulatory Visit: Payer: BLUE CROSS/BLUE SHIELD | Admitting: Occupational Therapy

## 2015-08-07 VITALS — BP 164/89 | HR 114

## 2015-08-07 DIAGNOSIS — R279 Unspecified lack of coordination: Secondary | ICD-10-CM

## 2015-08-07 DIAGNOSIS — R531 Weakness: Secondary | ICD-10-CM

## 2015-08-07 DIAGNOSIS — R269 Unspecified abnormalities of gait and mobility: Secondary | ICD-10-CM

## 2015-08-07 DIAGNOSIS — R2681 Unsteadiness on feet: Secondary | ICD-10-CM

## 2015-08-07 DIAGNOSIS — R278 Other lack of coordination: Secondary | ICD-10-CM

## 2015-08-07 NOTE — Therapy (Signed)
[place] [address] [place], [place], [address] Phone: [phone]   Fax:  [phone]  Physical Therapy Treatment  Patient Details  Name: Craig Wilcox MRN: [id number] Date of Birth: 1956-01-24 Referring Provider:  Clent Jacks, MD  Encounter Date: 08/07/2015      PT End of Session - 08/07/15 1917    Visit Number 6   Number of Visits 17   Date for PT Re-Evaluation 09/09/15   Authorization Type BCBS   PT Start Time 1015   PT Stop Time 1100   PT Time Calculation (min) 45 min   Equipment Utilized During Treatment Gait belt   Activity Tolerance Patient tolerated treatment well   Behavior During Therapy [facility] for tasks assessed/performed      Past Medical History  Diagnosis Date  . Chronic kidney disease, stage V requiring chronic dialysis ([place])   . CHF (congestive heart failure) [facility])     Past Surgical History  Procedure Laterality Date  . Insertion of dialysis catheter      peritoneal  . Cardiac electrophysiology mapping and ablation      Filed Vitals:   08/07/15 1059  BP: 164/89  Pulse: 114    Visit Diagnosis:  Weakness generalized  Abnormality of gait  Unsteadiness      Subjective Assessment - 08/07/15 1024    Subjective Pt reporting increased pain in L medial knee (area of adductor).   Pertinent History Goes by [name]". PMH: CKD, ESRD (performs peritoneal dialysis at home), CHF, DM II, COPD, HLD, gout, OSA, s/p osteomyelitis of L foot   Patient Stated Goals "Get back to work as soon as I can, get my endurance up, and be able to get up and walk around better."   Currently in Pain? No/denies                         [facility] Adult PT Treatment/Exercise - 08/07/15 0001    Transfers   Sit to Stand 6: Modified independent (Device/Increase time)   Stand to Sit 6: Modified independent (Device/Increase time)   Ambulation/Gait   Ambulation/Gait Yes   Ambulation/Gait Assistance 5:  Supervision;4: Min guard;6: Modified independent (Device/Increase time)   Ambulation/Gait Assistance Details x115' over level, indoor surfaces with RW and mod I; x450 over unlevel, paved surfaces with standing rest breaks x6 (20-40 seconds in duration) with SPC and supervision to min guard   Ambulation Distance (Feet) 565 Feet   Assistive device Rolling walker;Straight cane   Gait Pattern Step-through pattern;Decreased dorsiflexion - left;Left foot flat;Right foot flat;Trendelenburg;Lateral hip instability;Trunk flexed;Decreased dorsiflexion - right;Decreased hip/knee flexion - right;Decreased hip/knee flexion - left;Decreased trunk rotation;Left flexed knee in stance   Ambulation Surface Level;Unlevel;Indoor;Outdoor;Paved   Gait Comments Walking tolerance very limited when negotiating long inclined surface (paved) with SPC.   Neuro Re-ed    Neuro Re-ed Details  Pt performed all prior balance home exercies with mod I using paper handout. Progressed all balance exercises, as appropriate.See Pt Instructions for details on all exercises, reps/sets.                PT Education - 08/07/15 1912    Education provided Yes   Education Details HEP progressed; see Pt Instructions.   Person(s) Educated Patient   Methods Explanation;Demonstration;Verbal cues;Handout   Comprehension Verbalized understanding;Returned demonstration          PT Short Term Goals - 08/07/15 1911    PT SHORT TERM GOAL #1  Title Pt will perform initial HEP with mod I using paper handout to maximize functional gains made in physical therapy. Target date: 08/08/15   Baseline Met 08/07/15   Status Achieved   PT SHORT TERM GOAL #2   Title Pt will increase 6 Minute Walk Test distance from 692' to 781' to indicate progress toward significant improvement in functional endurance. Target date: 08/08/15   Baseline 10/7: 6MWT = 979'   Status Achieved   PT SHORT TERM GOAL #3   Title Perform Berg Balance Scale to  assess/address functional standing balance. Target date: 08/08/15   Baseline Per documentation on 9/20, baseline Berg score = 40/56.   Status Achieved   PT SHORT TERM GOAL #4   Title Pt will perform sit to stand transfer from standard chair without use of UE's or compensatory strategies to indicate improved functional LE strength. Target date: 08/08/15   Baseline 10/11: Inconsistently able to perform   Status Partially Met           PT Long Term Goals - 08/03/15 1130    PT LONG TERM GOAL #1   Title Pt will improve Berg score by 8 points from baseline to indicate significant improvement in functional standing balance.  Target date: 09/05/15   Baseline 9/20: Baseline Berg score = 40/56   PT LONG TERM GOAL #2   Title Pt will increase 6 Minute Walk Test distance from 692' to 869' to indicate significant improvement in functional endurance.  Target date: 09/05/15   Baseline 10/7: 6MWT = 979'   Status Achieved   PT LONG TERM GOAL #3   Title Pt will independently ambulate 200' over level, indoor surfaces with effective obstacle negotiation to indicate independence with household mobility, progress toward return to PLOF.  Target date: 09/05/15   PT LONG TERM GOAL #4   Title Pt will ambulate x500' over paved, outdoor surfaces with mod I using LRAD to indicate increased safety with community mobility.  Target date: 09/05/15   PT LONG TERM GOAL #5   Title Pt will negotiate standard ramp and curb step with mod I using LRAD to indicate ability to safely traverse community obstacles.  Target date: 09/05/15   PT LONG TERM GOAL #6   Title Pt will improve ABC Scale from 0.6% to >14 % to indicate significant improvement in balance confidence. Target date: 09/05/15   PT LONG TERM GOAL #7   Title Pt will negotiate 7 stairs with single rail and mod I to enable to access all levels of home.  Target date: 09/05/15  Pt reports plan to install rails in home prior to moving home from [place] - 08/07/15 1918    Clinical Impression Statement All STG's met/surpassed, with exception of STG for sit <> stand from standard chair without UE use (partially met due to inconsistent ability to perform). Noted significant limitation in endurance/waking tolerance when ambulating up inclined surface with SPC. Continue per POC.   Pt will benefit from skilled therapeutic intervention in order to improve on the following deficits Abnormal gait;Cardiopulmonary status limiting activity;Decreased activity tolerance;Decreased balance;Decreased strength;Decreased endurance;Decreased knowledge of use of DME   Rehab Potential Good   PT Frequency 2x / week   PT Duration 8 weeks   PT Treatment/Interventions ADLs/Self Care Home Management;Vestibular;Manual techniques;Therapeutic activities;Therapeutic exercise;Balance training;Neuromuscular re-education;Gait training;Patient/family education;Functional mobility training;Orthotic Fit/Training;Stair training;DME Instruction   PT Next Visit Plan Continue to address LE  strengthening, functional endurance/activity tolerance, and standing balance. Gait training with SPC.   Consulted and Agree with Plan of Care Patient        Problem List Patient Active Problem List   Diagnosis Date Noted  . Upper airway cough syndrome 12/22/2013  . OTHER ACUTE SINUSITIS 10/18/2008  . ACUTE BRONCHITIS 10/18/2008  . SLEEP APNEA 01/11/2008  . DIABETES MELLITUS, TYPE II 08/30/2007  . HYPERLIPIDEMIA 08/30/2007  . HYPERTENSION 08/30/2007  . CONGESTIVE HEART FAILURE 08/30/2007  . ALLERGIC RHINITIS 08/30/2007  . OBSTRUCTIVE CHRONIC BRONCHITIS 08/30/2007  . ASTHMA 08/30/2007   Billie Ruddy, PT, DPT [facility] [address] [place], [place], [address] Phone: [phone]   Fax:  [phone] 08/07/2015, 7:22 PM

## 2015-08-07 NOTE — Patient Instructions (Addendum)
HIP: Flexion / KNEE: Extension, Straight Leg Raise   Raise leg, keeping knee straight. SLOWLY lower your leg back down to resting position. Make sure your heel and the back of your knee touch the bed at the same time.   Do 10 reps on the right leg. Then switch to the left leg, performing10 reps. Do 3 sets per day.    "I love a Licensed conveyancer   At counter for balance as needed: walk forward with high knees, 3 second pauses with each knee lift. Make sure your heel touches first.  Then, walk backward with high knees. Repeat 3 laps each way. Do _1-2_ sessions per day.   Side-Stepping   At counter for balance as needed: Walk to left side with eyes open. Take even steps, leading with same foot. Make sure each foot lifts off the floor. Repeat in opposite direction. Keep feet pointed toward counter. Repeat for 3 laps each way. Do _1-2__ sessions per day.  .  Walking on Toes   At counter for balance as needed: Walk on toes forward while continuing on a straight path, and then backwards on toes to starting position. Repeat 3 laps each way. Do _1-2___ sessions per day.    Feet Heel-Toe "Tandem"   At counter: Arms at sides, walk a straight line forward bringing one foot directly in front of the other, and then a straight line backwards bringing one foot directly behind the other one.  Repeat for _3 laps each way. Do _1-2_ sessions per day.

## 2015-08-07 NOTE — Patient Instructions (Signed)
      Strengthening: Resisted Extension   Hold tubing in __each___ hand(s), arm forward. Pull arm back, elbow straight. Repeat _10___ times per set. Do _1-2___ sessions per day, every other day.   Resisted Horizontal Abduction: Bilateral   Sit  tubing in both hands, arms out in front. Keeping arms straight, pinch shoulder blades together and stretch arms out. Repeat _10___ times per set. Do _1-2___ sessions per day, every other day.   Elbow Flexion: Resisted   With tubing held in ___one___ hand(s) and other end secured under foot, curl arm up as far as possible. Repeat _10___ times per set. Do _1-2___ sessions per day, every other day.    Elbow Extension: Resisted   Sit in chair with resistive band secured at armrest (or hold with other hand) and _one______ elbow bent. Straighten elbow. Repeat _10___ times per set.  Do _1-2___ sessions per day, every other day.   Copyright  VHI. All rights reserved.

## 2015-08-07 NOTE — Therapy (Addendum)
[facility] [address] [place], [place], [address] Phone: [phone]   Fax:  [phone]  Occupational Therapy Treatment  Patient Details  Name: Craig Wilcox MRN: [id number] Date of Birth: 07/14/1956 Referring Provider:  Rowe Pavy, MD  Encounter Date: 08/07/2015      OT End of Session - 08/07/15 1107    Visit Number 6   Number of Visits 17   Date for OT Re-Evaluation 09/08/15   Authorization Type BCBS   OT Start Time 1106   OT Stop Time 1145   OT Time Calculation (min) 39 min   Activity Tolerance Patient tolerated treatment well   Behavior During Therapy [facility] for tasks assessed/performed      Past Medical History  Diagnosis Date  . Chronic kidney disease, stage V requiring chronic dialysis (HCC)   . CHF (congestive heart failure) [facility])     Past Surgical History  Procedure Laterality Date  . Insertion of dialysis catheter      peritoneal  . Cardiac electrophysiology mapping and ablation      There were no vitals filed for this visit.  Visit Diagnosis:  Weakness generalized  Decreased coordination      Subjective Assessment - 08/07/15 1108    Limitations see Epic   Patient Stated Goals to increase strength and coordination   Currently in Pain? No/denies      Treatment: supine cane exercises for shoulder flexion and chest press, 20 reps each followed by strengthening in supine, chest press 2 sets of 10 reps with 5 lbs in each hand,  shoulder flexion 2 sets of 10 reps with 3 lbs weight. Seated biceps curls 2 sets of 10 reps with 5 lbs weight  Arm bike x 8 mins level 5 for conditioning. Pt was instructed in green theraband HEP, 10 reps eah exercise bilaterally.                         OT Education - 08/07/15 1548    Education provided Yes   Education Details theraband HEP(green)   Person(s) Educated Patient   Methods Explanation;Demonstration;Handout   Comprehension  Verbalized understanding;Returned demonstration          OT Short Term Goals - 08/07/15 1124    OT SHORT TERM GOAL #1   Title I with HEP.   Baseline due 08/09/15   Time 4   Period Weeks   Status On-going   OT SHORT TERM GOAL #2   Title Pt will demonstrate an 8 lbs improvement in bilateral grip strength for increased functional use.   Time 4   Period Weeks   Status On-going   OT SHORT TERM GOAL #3   Title Pt will perform light home management/ cooking task in standing with min A.   Time 4   Period Weeks   Status On-going   OT SHORT TERM GOAL #4   Title Pt will demonstrate ability to retrieve a light weight object  at 120 bilaterally.   Time 4   Period Weeks   Status On-going   OT SHORT TERM GOAL #5   Title Pt will increase RUE wrist extension to 55 * for increased functional use.   Time 4   Period Weeks   Status On-going   OT SHORT TERM GOAL #6   Title Pt will perform all basic ADLs modified independently.   Baseline per pt report   Time 4   Period Weeks   Status  Achieved           OT Long Term Goals - 07/11/15 1634    OT LONG TERM GOAL #1   Title Pt will perform basic home management/ cooking at a walker level modified independently without LOB.   Baseline due 09/08/15   Time 8   Period Weeks   Status New   OT LONG TERM GOAL #2   Title Pt will demonstrate improved fine motor coordination as evidenced by decreasing RUE 9 hole peg test score to 32 secs or less.   Time 8   Period Weeks   Status New   OT LONG TERM GOAL #3   Title Pt will perform simulated work activities modified independently.   Time 8   Period Weeks   Status New   OT LONG TERM GOAL #4   Title Pt will demonstrate improved functional reach and standing balance as evidenced by perfoming 10 inches bilaterally on standing functional reach test.   Time 8   Period Weeks   Status New               Plan - 08/07/15 1123    Clinical Impression Statement Pt is progressing with improving  UE strength and endurance.   Pt will benefit from skilled therapeutic intervention in order to improve on the following deficits (Retired) Abnormal gait;Decreased coordination;Decreased range of motion;Difficulty walking;Decreased safety awareness;Decreased endurance;Decreased activity tolerance;Decreased knowledge of precautions;Pain;Impaired UE functional use;Decreased balance;Decreased mobility;Decreased strength   Rehab Potential Good   OT Frequency 2x / week   OT Duration 8 weeks   OT Treatment/Interventions Self-care/ADL training;Therapeutic exercise;Patient/family education;Splinting;Balance training;Manual Therapy;Neuromuscular education;Energy conservation;Therapeutic exercises;Therapeutic activities;DME and/or AE instruction;Parrafin;Cryotherapy;Electrical Stimulation;Fluidtherapy;Gait Training;Moist Heat;Contrast Bath;Passive range of motion   Plan simulated work activities/ fine motor coordination,UE strength, ROM   Consulted and Agree with Plan of Care Patient        Problem List Patient Active Problem List   Diagnosis Date Noted  . Upper airway cough syndrome 12/22/2013  . OTHER ACUTE SINUSITIS 10/18/2008  . ACUTE BRONCHITIS 10/18/2008  . SLEEP APNEA 01/11/2008  . DIABETES MELLITUS, TYPE II 08/30/2007  . HYPERLIPIDEMIA 08/30/2007  . HYPERTENSION 08/30/2007  . CONGESTIVE HEART FAILURE 08/30/2007  . ALLERGIC RHINITIS 08/30/2007  . OBSTRUCTIVE CHRONIC BRONCHITIS 08/30/2007  . ASTHMA 08/30/2007    [name] 08/07/2015, 3:59 PM Keene Breath, OTR/L [phone] Phone: [phone] 3:59 PM 08/07/2015 [facility] Rehabilitation [facility] [address] [place], [place], [address] Phone: [phone]   Fax:  [phone]

## 2015-08-09 ENCOUNTER — Ambulatory Visit: Payer: BLUE CROSS/BLUE SHIELD | Admitting: Occupational Therapy

## 2015-08-09 ENCOUNTER — Ambulatory Visit: Payer: BLUE CROSS/BLUE SHIELD | Admitting: Physical Therapy

## 2015-08-09 DIAGNOSIS — R531 Weakness: Secondary | ICD-10-CM

## 2015-08-09 DIAGNOSIS — R2681 Unsteadiness on feet: Secondary | ICD-10-CM

## 2015-08-09 DIAGNOSIS — R269 Unspecified abnormalities of gait and mobility: Secondary | ICD-10-CM

## 2015-08-09 DIAGNOSIS — R278 Other lack of coordination: Secondary | ICD-10-CM

## 2015-08-09 DIAGNOSIS — R279 Unspecified lack of coordination: Secondary | ICD-10-CM

## 2015-08-09 NOTE — Therapy (Signed)
[facility] [address] [place], [place], [address] Phone: [phone]   Fax:  [phone]  Occupational Therapy Treatment  Patient Details  Name: Craig Wilcox MRN: [id number] Date of Birth: [birth year] Referring Provider:  Clent Jacks, MD  Encounter Date: 08/09/2015      OT End of Session - 08/09/15 1441    Visit Number 7   Date for OT Re-Evaluation 09/08/15   Authorization Type BCBS   OT Start Time 5284   OT Stop Time 1445   OT Time Calculation (min) 40 min   Activity Tolerance Patient tolerated treatment well   Behavior During Therapy [facility] for tasks assessed/performed      Past Medical History  Diagnosis Date  . Chronic kidney disease, stage V requiring chronic dialysis ([place])   . CHF (congestive heart failure) [facility])     Past Surgical History  Procedure Laterality Date  . Insertion of dialysis catheter      peritoneal  . Cardiac electrophysiology mapping and ablation      There were no vitals filed for this visit.  Visit Diagnosis:  Weakness generalized  Decreased coordination      Subjective Assessment - 08/09/15 1407    Currently in Pain? Yes   Pain Score 1    Pain Location Knee   Pain Orientation Left   Pain Descriptors / Indicators Sore   Pain Type Acute pain   Pain Onset In the past 7 days   Pain Frequency Constant   Aggravating Factors  walking   Pain Relieving Factors unknown   Multiple Pain Sites No      Treatment: Supine shoulder flexion with 4lbs, chest press and biceps curls with 5 lbs x 2 reps of 10 reps each Checked progress towards goals. Pt demonstrates excellent overall progress. Fine motor coordination tasks, theraputty upgraded to green for bilateral grip and pinch, 10-20 reps each exercise min v.c. O'connor tweezer activity with bilateral UE's for increased sustained pinch and coordination, min difficulty/ v.c.                          OT Short  Term Goals - 08/09/15 1422    OT SHORT TERM GOAL #1   Title I with HEP.   Baseline due 08/09/15   Time 4   Period Weeks   Status Achieved   OT SHORT TERM GOAL #2   Title Pt will demonstrate an 8 lbs improvement in bilateral grip strength for increased functional use.   Baseline RUE 40, LUE 53   Time 4   Period Weeks   Status Achieved   OT SHORT TERM GOAL #3   Title Pt will perform light home management/ cooking task in standing with min A.   Baseline per pt report   Time 4   Period Weeks   Status Achieved   OT SHORT TERM GOAL #4   Title Pt will demonstrate ability to retrieve a light weight object  at 120 bilaterally.   Baseline met 120 for RUE, 130 for left   Time 4   Period Weeks   Status Achieved   OT SHORT TERM GOAL #5   Title Pt will increase RUE wrist extension to 55 * for increased functional use.   Time 4   Period Weeks   Status Achieved   OT SHORT TERM GOAL #6   Title Pt will perform all basic ADLs modified independently.   Baseline per pt report  Time 4   Period Weeks   Status Achieved           OT Long Term Goals - 07/11/15 1634    OT LONG TERM GOAL #1   Title Pt will perform basic home management/ cooking at a walker level modified independently without LOB.   Baseline due 09/08/15   Time 8   Period Weeks   Status New   OT LONG TERM GOAL #2   Title Pt will demonstrate improved fine motor coordination as evidenced by decreasing RUE 9 hole peg test score to 32 secs or less.   Time 8   Period Weeks   Status New   OT LONG TERM GOAL #3   Title Pt will perform simulated work activities modified independently.   Time 8   Period Weeks   Status New   OT LONG TERM GOAL #4   Title Pt will demonstrate improved functional reach and standing balance as evidenced by perfoming 10 inches bilaterally on standing functional reach test.   Time 8   Period Weeks   Status New               Plan - 08/09/15 1408    Clinical Impression Statement Pt is  progressing towards goals for strength and coordination   Pt will benefit from skilled therapeutic intervention in order to improve on the following deficits (Retired) Abnormal gait;Decreased coordination;Decreased range of motion;Difficulty walking;Decreased safety awareness;Decreased endurance;Decreased activity tolerance;Decreased knowledge of precautions;Pain;Impaired UE functional use;Decreased balance;Decreased mobility;Decreased strength   Rehab Potential Good   OT Frequency 2x / week   OT Duration 8 weeks   OT Treatment/Interventions Self-care/ADL training;Therapeutic exercise;Patient/family education;Splinting;Balance training;Manual Therapy;Neuromuscular education;Energy conservation;Therapeutic exercises;Therapeutic activities;DME and/or AE instruction;Parrafin;Cryotherapy;Electrical Stimulation;Fluidtherapy;Gait Training;Moist Heat;Contrast Bath;Passive range of motion   Plan coordination, work activities   Oncologist with Plan of Care Patient;Family member/caregiver        Problem List Patient Active Problem List   Diagnosis Date Noted  . Upper airway cough syndrome 12/22/2013  . OTHER ACUTE SINUSITIS 10/18/2008  . ACUTE BRONCHITIS 10/18/2008  . SLEEP APNEA 01/11/2008  . DIABETES MELLITUS, TYPE II 08/30/2007  . HYPERLIPIDEMIA 08/30/2007  . HYPERTENSION 08/30/2007  . CONGESTIVE HEART FAILURE 08/30/2007  . ALLERGIC RHINITIS 08/30/2007  . OBSTRUCTIVE CHRONIC BRONCHITIS 08/30/2007  . ASTHMA 08/30/2007    Rockelle Heuerman 08/09/2015, 8:15 PM [name], OTR/L [phone] Phone: [phone] 8:15 PM 08/09/2015 [facility] [address] [place], [place], [address] Phone: [phone]   Fax:  [phone]

## 2015-08-09 NOTE — Patient Instructions (Signed)
Knee Roll    Lying on back, with knees bent and feet flat on floor, arms outstretched to sides, slowly roll both knees to side, hold 5 seconds. Back to starting position, hold 5 seconds. Then to opposite side, hold 5 seconds. Return to starting position. Keep shoulders and arms in contact with floor. Perform 30 reps, 2-3 times per day.   Copyright  VHI. All rights reserved.

## 2015-08-09 NOTE — Therapy (Signed)
[facility] [address] [place], [place], [address] Phone: [phone]   Fax:  [phone]  Physical Therapy Treatment  Patient Details  Name: Craig Wilcox MRN: [id number] Date of Birth: Feb 09, 1956 No Data Recorded  Encounter Date: 08/09/2015      PT End of Session - 08/09/15 2237    Visit Number 7   Number of Visits 17   Date for PT Re-Evaluation 09/09/15   Authorization Type BCBS   PT Start Time 6803   PT Stop Time 1400   PT Time Calculation (min) 43 min   Equipment Utilized During Treatment Gait belt   Activity Tolerance Patient tolerated treatment well   Behavior During Therapy [facility] for tasks assessed/performed      Past Medical History  Diagnosis Date  . Chronic kidney disease, stage V requiring chronic dialysis ([place])   . CHF (congestive heart failure) [facility])     Past Surgical History  Procedure Laterality Date  . Insertion of dialysis catheter      peritoneal  . Cardiac electrophysiology mapping and ablation      There were no vitals filed for this visit.  Visit Diagnosis:  Unsteadiness  Weakness generalized  Decreased coordination  Abnormality of gait      Subjective Assessment - 08/09/15 1324    Subjective Pt reports he still has some soreness on "the side of my left knee". Denies falls.   Pertinent History Goes by [name]". PMH: CKD, ESRD (performs peritoneal dialysis at home), CHF, DM II, COPD, HLD, gout, OSA, s/p osteomyelitis of L foot   Patient Stated Goals "Get back to work as soon as I can, get my endurance up, and be able to get up and walk around better."   Currently in Pain? Yes   Pain Location Knee   Pain Orientation Left   Pain Descriptors / Indicators Sore   Pain Type Acute pain   Pain Onset In the past 7 days   Pain Frequency Constant   Aggravating Factors  "I can feel it when I walk a little it."   Pain Relieving Factors pt unsure   Multiple Pain Sites No                          [facility] Adult PT Treatment/Exercise - 08/09/15 0001    Ambulation/Gait   Ambulation/Gait Yes   Ambulation/Gait Assistance 4: Min guard   Ambulation Distance (Feet) 345 Feet   Assistive device Straight cane   Gait Pattern Step-through pattern;Decreased dorsiflexion - left;Left foot flat;Right foot flat;Trendelenburg;Lateral hip instability;Trunk flexed;Decreased dorsiflexion - right;Decreased hip/knee flexion - right;Decreased hip/knee flexion - left;Decreased trunk rotation;Left flexed knee in stance   Ambulation Surface Level;Indoor   Stairs Yes   Stairs Assistance 4: Min guard;5: Supervision   Stairs Assistance Details (indicate cue type and reason) Initial trial x4 stairs and single R rail, step-to pattern with supervision. Subsequent trials 4 steps x2 without rails with SPC with min guard and cueing for sequencing with effective return demonstration.   Number of Stairs 12   Height of Stairs 6   Curb 5: Supervision   Curb Details (indicate cue type and reason) with [facility]   Neuro Re-ed    Neuro Re-ed Details  See Balance interventions.   Exercises   Exercises Other Exercises   Other Exercises  Supine: lower trunk rotation x20 reps to increase trunk rotation, during ambulation.  Balance Exercises - 08/09/15 2225    Balance Exercises: Standing   SLS Eyes open;Solid surface;5 reps;10 secs   Stepping Strategy Anterior;Posterior;Foam/compliant surface;Other reps (comment)  forward/retro stepping onto/off of foam beam x20 reps bilat   Rockerboard Anterior/posterior;Lateral;EO;30 seconds;5 reps;Intermittent UE support  R/L semi tandem   Tandem Gait Forward;Intermittent upper extremity support;Other (comment)  10' x8 reps with mirror   Partial Tandem Stance Eyes open;Intermittent upper extremity support;30 secs   Retro Gait 4 reps  4 x10' without UE support, (S)           PT Education - 08/09/15 2236    Education provided Yes    Education Details Added HEP for lower trunk rotation to increase gait stability.    Person(s) Educated Patient   Methods Explanation;Demonstration;Tactile cues;Verbal cues;Handout   Comprehension Verbalized understanding;Returned demonstration          PT Short Term Goals - 08/09/15 2230    PT SHORT TERM GOAL #1   Title Pt will perform initial HEP with mod I using paper handout to maximize functional gains made in physical therapy. Target date: 08/08/15   Baseline Met 08/07/15   Status Achieved   PT SHORT TERM GOAL #2   Title Pt will increase 6 Minute Walk Test distance from 692' to 781' to indicate progress toward significant improvement in functional endurance. Target date: 08/08/15   Baseline 10/7: 6MWT = 979'   Status Achieved   PT SHORT TERM GOAL #3   Title Perform Berg Balance Scale to assess/address functional standing balance. Target date: 08/08/15   Baseline Per documentation on 9/20, baseline Berg score = 40/56.   Status Achieved   PT SHORT TERM GOAL #4   Title Pt will perform sit to stand transfer from standard chair without use of UE's or compensatory strategies to indicate improved functional LE strength. Target date: 08/08/15   Baseline Achieved 10/13.   Status Achieved           PT Long Term Goals - 08/03/15 1130    PT LONG TERM GOAL #1   Title Pt will improve Berg score by 8 points from baseline to indicate significant improvement in functional standing balance.  Target date: 09/05/15   Baseline 9/20: Baseline Berg score = 40/56   PT LONG TERM GOAL #2   Title Pt will increase 6 Minute Walk Test distance from 692' to 869' to indicate significant improvement in functional endurance.  Target date: 09/05/15   Baseline 10/7: 6MWT = 979'   Status Achieved   PT LONG TERM GOAL #3   Title Pt will independently ambulate 200' over level, indoor surfaces with effective obstacle negotiation to indicate independence with household mobility, progress toward return to PLOF.   Target date: 09/05/15   PT LONG TERM GOAL #4   Title Pt will ambulate x500' over paved, outdoor surfaces with mod I using LRAD to indicate increased safety with community mobility.  Target date: 09/05/15   PT LONG TERM GOAL #5   Title Pt will negotiate standard ramp and curb step with mod I using LRAD to indicate ability to safely traverse community obstacles.  Target date: 09/05/15   PT LONG TERM GOAL #6   Title Pt will improve ABC Scale from 0.6% to >14 % to indicate significant improvement in balance confidence. Target date: 09/05/15   PT LONG TERM GOAL #7   Title Pt will negotiate 7 stairs with single rail and mod I to enable to access all levels of home.  Target  date: 09/05/15  Pt reports plan to install rails in home prior to moving home from [place] - 08/09/15 2230    Clinical Impression Statement Skilled session focused on standing balance with narrow BOS, single limb stance. Noted excessive lateral trunk movement (L > R) during balance activities. Also noted pt tendencyy to maintain majoirity of WB on LLE when balance challenged. Pt met STG for sit <> stand without UE use. Conitnue per POC.   Pt will benefit from skilled therapeutic intervention in order to improve on the following deficits Abnormal gait;Cardiopulmonary status limiting activity;Decreased activity tolerance;Decreased balance;Decreased strength;Decreased endurance;Decreased knowledge of use of DME   Rehab Potential Good   PT Frequency 2x / week   PT Duration 8 weeks   PT Next Visit Plan Continue to address LE strengthening, functional endurance/activity tolerance, and standing balance. Trunk rotation during gait. Gait training with SPC/without AD.   Consulted and Agree with Plan of Care Patient        Problem List Patient Active Problem List   Diagnosis Date Noted  . Upper airway cough syndrome 12/22/2013  . OTHER ACUTE SINUSITIS 10/18/2008  . ACUTE BRONCHITIS 10/18/2008  . SLEEP  APNEA 01/11/2008  . DIABETES MELLITUS, TYPE II 08/30/2007  . HYPERLIPIDEMIA 08/30/2007  . HYPERTENSION 08/30/2007  . CONGESTIVE HEART FAILURE 08/30/2007  . ALLERGIC RHINITIS 08/30/2007  . OBSTRUCTIVE CHRONIC BRONCHITIS 08/30/2007  . ASTHMA 08/30/2007    Billie Ruddy, PT, DPT [facility] [address] [place], [place], [address] Phone: [phone]   Fax:  [phone] 08/09/2015, 10:39 PM    Name: Kacen Mellinger MRN: [id number] Date of Birth: Feb 03, 1956

## 2015-08-14 ENCOUNTER — Ambulatory Visit: Payer: BLUE CROSS/BLUE SHIELD | Admitting: Physical Therapy

## 2015-08-14 ENCOUNTER — Ambulatory Visit: Payer: BLUE CROSS/BLUE SHIELD | Admitting: Occupational Therapy

## 2015-08-14 ENCOUNTER — Encounter: Payer: Self-pay | Admitting: Physical Therapy

## 2015-08-14 DIAGNOSIS — R279 Unspecified lack of coordination: Secondary | ICD-10-CM

## 2015-08-14 DIAGNOSIS — R531 Weakness: Secondary | ICD-10-CM

## 2015-08-14 DIAGNOSIS — R278 Other lack of coordination: Secondary | ICD-10-CM

## 2015-08-14 DIAGNOSIS — R2681 Unsteadiness on feet: Secondary | ICD-10-CM

## 2015-08-14 DIAGNOSIS — R269 Unspecified abnormalities of gait and mobility: Secondary | ICD-10-CM

## 2015-08-14 NOTE — Therapy (Signed)
[facility] [address] [place], [place], [address] Phone: [phone]   Fax:  [phone]  Occupational Therapy Treatment  Patient Details  Name: Craig Wilcox MRN: [id number] Date of Birth: 08-30-1956 No Data Recorded  Encounter Date: 08/14/2015      OT End of Session - 08/14/15 1359    Visit Number 8   Number of Visits 17   Date for OT Re-Evaluation 09/08/15   Authorization Type BCBS   OT Start Time 1320   OT Stop Time 1400   OT Time Calculation (min) 40 min   Activity Tolerance Patient tolerated treatment well   Behavior During Therapy [facility] for tasks assessed/performed      Past Medical History  Diagnosis Date  . Chronic kidney disease, stage V requiring chronic dialysis ([place])   . CHF (congestive heart failure) [facility])     Past Surgical History  Procedure Laterality Date  . Insertion of dialysis catheter      peritoneal  . Cardiac electrophysiology mapping and ablation      There were no vitals filed for this visit.  Visit Diagnosis:  Weakness generalized  Decreased coordination      Subjective Assessment - 08/14/15 1731    Subjective  Pt reports he can tell he is getting stronger   Limitations see Epic   Patient Stated Goals to increase strength and coordination   Currently in Pain? No/denies       Treatment: fine motor coordination activities:Purgue pegboard with bilateral UE's for incr. Fine motor coordination, followed by placing/ removing bolts from bolt board, min difficulty/ v.c. To avoid compensation. Gripper set at 35 lbs to pick up 1 inch blocks with RUE, min -mod difficulty for sustained grip, min v.c. Arm bike x 8 mins level 8 for conditioning, Shoulder flexion, then chest press with 2lbs bilaterally and biceps curls with 5 lbs bilaterally 10-15 reps each                             OT Short Term Goals - 08/09/15 1422    OT SHORT TERM GOAL #1   Title I with  HEP.   Baseline due 08/09/15   Time 4   Period Weeks   Status Achieved   OT SHORT TERM GOAL #2   Title Pt will demonstrate an 8 lbs improvement in bilateral grip strength for increased functional use.   Baseline RUE 40, LUE 53   Time 4   Period Weeks   Status Achieved   OT SHORT TERM GOAL #3   Title Pt will perform light home management/ cooking task in standing with min A.   Baseline per pt report   Time 4   Period Weeks   Status Achieved   OT SHORT TERM GOAL #4   Title Pt will demonstrate ability to retrieve a light weight object  at 120 bilaterally.   Baseline met 120 for RUE, 130 for left   Time 4   Period Weeks   Status Achieved   OT SHORT TERM GOAL #5   Title Pt will increase RUE wrist extension to 55 * for increased functional use.   Time 4   Period Weeks   Status Achieved   OT SHORT TERM GOAL #6   Title Pt will perform all basic ADLs modified independently.   Baseline per pt report   Time 4   Period Weeks   Status Achieved  OT Long Term Goals - 07/11/15 1634    OT LONG TERM GOAL #1   Title Pt will perform basic home management/ cooking at a walker level modified independently without LOB.   Baseline due 09/08/15   Time 8   Period Weeks   Status New   OT LONG TERM GOAL #2   Title Pt will demonstrate improved fine motor coordination as evidenced by decreasing RUE 9 hole peg test score to 32 secs or less.   Time 8   Period Weeks   Status New   OT LONG TERM GOAL #3   Title Pt will perform simulated work activities modified independently.   Time 8   Period Weeks   Status New   OT LONG TERM GOAL #4   Title Pt will demonstrate improved functional reach and standing balance as evidenced by perfoming 10 inches bilaterally on standing functional reach test.   Time 8   Period Weeks   Status New               Plan - 08/14/15 1355    Clinical Impression Statement Pt  is progressing towards goals for strength and coordination   Pt will  benefit from skilled therapeutic intervention in order to improve on the following deficits (Retired) Abnormal gait;Decreased coordination;Decreased range of motion;Difficulty walking;Decreased safety awareness;Decreased endurance;Decreased activity tolerance;Decreased knowledge of precautions;Pain;Impaired UE functional use;Decreased balance;Decreased mobility;Decreased strength   Rehab Potential Good   OT Duration 8 weeks   OT Treatment/Interventions Self-care/ADL training;Therapeutic exercise;Patient/family education;Splinting;Balance training;Manual Therapy;Neuromuscular education;Energy conservation;Therapeutic exercises;Therapeutic activities;DME and/or AE instruction;Parrafin;Cryotherapy;Electrical Stimulation;Fluidtherapy;Gait Training;Moist Heat;Contrast Bath;Passive range of motion   Plan coordination/ strenght        Problem List Patient Active Problem List   Diagnosis Date Noted  . Upper airway cough syndrome 12/22/2013  . OTHER ACUTE SINUSITIS 10/18/2008  . ACUTE BRONCHITIS 10/18/2008  . SLEEP APNEA 01/11/2008  . DIABETES MELLITUS, TYPE II 08/30/2007  . HYPERLIPIDEMIA 08/30/2007  . HYPERTENSION 08/30/2007  . CONGESTIVE HEART FAILURE 08/30/2007  . ALLERGIC RHINITIS 08/30/2007  . OBSTRUCTIVE CHRONIC BRONCHITIS 08/30/2007  . ASTHMA 08/30/2007    Latania Bascomb 08/14/2015, 5:33 PM [name], OTR/L [phone] Phone: [phone] 5:33 PM 08/14/2015 [facility] [address] [place], [place], [address] Phone: [phone]   Fax:  [phone]  Name: Craig Wilcox MRN: [id number] Date of Birth: 05/23/1956

## 2015-08-14 NOTE — Therapy (Signed)
[facility] [address] [place], [place], [address] Phone: [phone]   Fax:  [phone]  Physical Therapy Treatment  Patient Details  Name: Craig Wilcox MRN: [id number] Date of Birth: 02-01-1956 No Data Recorded  Encounter Date: 08/14/2015      PT End of Session - 08/14/15 1238    Visit Number 8   Number of Visits 17   Date for PT Re-Evaluation 09/09/15   Authorization Type BCBS   PT Start Time 1233   PT Stop Time 1315   PT Time Calculation (min) 42 min   Equipment Utilized During Treatment Gait belt   Activity Tolerance Patient tolerated treatment well   Behavior During Therapy [facility] for tasks assessed/performed      Past Medical History  Diagnosis Date  . Chronic kidney disease, stage V requiring chronic dialysis ([place])   . CHF (congestive heart failure) [place] [facility])     Past Surgical History  Procedure Laterality Date  . Insertion of dialysis catheter      peritoneal  . Cardiac electrophysiology mapping and ablation      There were no vitals filed for this visit.  Visit Diagnosis:  Weakness generalized  Decreased coordination  Unsteadiness  Abnormality of gait  Generalized weakness      Subjective Assessment - 08/14/15 1237    Subjective No new complaints. No falls.  HEP going well.   Pertinent History Goes by [name]". PMH: CKD, ESRD (performs peritoneal dialysis at home), CHF, DM II, COPD, HLD, gout, OSA, s/p osteomyelitis of L foot   Currently in Pain? No/denies   Pain Score 0-No pain          [facility] Adult PT Treatment/Exercise - 08/14/15 1239    Transfers   Sit to Stand 6: Modified independent (Device/Increase time)   Stand to Sit 6: Modified independent (Device/Increase time)   Ambulation/Gait   Ambulation/Gait Yes   Ambulation/Gait Assistance 4: Min guard   Ambulation/Gait Assistance Details cues on posture and to correct gait deviations. 1 standing rest break with each outdoor gait  trial, none with 1st one inddoors.   Ambulation Distance (Feet) 430 Feet  x1 indoors; 510 feet, x2 outdoor/indoor combined   Assistive device Straight cane   Gait Pattern Step-through pattern;Decreased stride length;Narrow base of support;Trunk flexed;Decreased trunk rotation;Trendelenburg;Decreased hip/knee flexion - right;Decreased hip/knee flexion - left   Ambulation Surface Level;Indoor   Curb 5: Supervision  with cane, outdoor curb x 2 reps   Curb Details (indicate cue type and reason) cues on posture and sequence     Neuro Re-ed: Single leg stance activities: With 4 inch box: alternating fwd and cross toe taps x 10 each With 6 inch box: alternating fwd and cross toe taps x 10 each         PT Short Term Goals - 08/09/15 2230    PT SHORT TERM GOAL #1   Title Pt will perform initial HEP with mod I using paper handout to maximize functional gains made in physical therapy. Target date: 08/08/15   Baseline Met 08/07/15   Status Achieved   PT SHORT TERM GOAL #2   Title Pt will increase 6 Minute Walk Test distance from 692' to 781' to indicate progress toward significant improvement in functional endurance. Target date: 08/08/15   Baseline 10/7: 6MWT = 979'   Status Achieved   PT SHORT TERM GOAL #3   Title Perform Berg Balance Scale to assess/address functional standing balance. Target date: 08/08/15   Baseline Per  documentation on 9/20, baseline Berg score = 40/56.   Status Achieved   PT SHORT TERM GOAL #4   Title Pt will perform sit to stand transfer from standard chair without use of UE's or compensatory strategies to indicate improved functional LE strength. Target date: 08/08/15   Baseline Achieved 10/13.   Status Achieved           PT Long Term Goals - 08/03/15 1130    PT LONG TERM GOAL #1   Title Pt will improve Berg score by 8 points from baseline to indicate significant improvement in functional standing balance.  Target date: 09/05/15   Baseline 9/20: Baseline  Berg score = 40/56   PT LONG TERM GOAL #2   Title Pt will increase 6 Minute Walk Test distance from 692' to 869' to indicate significant improvement in functional endurance.  Target date: 09/05/15   Baseline 10/7: 6MWT = 979'   Status Achieved   PT LONG TERM GOAL #3   Title Pt will independently ambulate 200' over level, indoor surfaces with effective obstacle negotiation to indicate independence with household mobility, progress toward return to PLOF.  Target date: 09/05/15   PT LONG TERM GOAL #4   Title Pt will ambulate x500' over paved, outdoor surfaces with mod I using LRAD to indicate increased safety with community mobility.  Target date: 09/05/15   PT LONG TERM GOAL #5   Title Pt will negotiate standard ramp and curb step with mod I using LRAD to indicate ability to safely traverse community obstacles.  Target date: 09/05/15   PT LONG TERM GOAL #6   Title Pt will improve ABC Scale from 0.6% to >14 % to indicate significant improvement in balance confidence. Target date: 09/05/15   PT LONG TERM GOAL #7   Title Pt will negotiate 7 stairs with single rail and mod I to enable to access all levels of home.  Target date: 09/05/15  Pt reports plan to install rails in home prior to moving home from [place] - 08/14/15 1238    Clinical Impression Statement Continued to work on gait with straight cane and balance activiites today without any issues reported. Pt making steady progress toward goals.   Pt will benefit from skilled therapeutic intervention in order to improve on the following deficits Abnormal gait;Cardiopulmonary status limiting activity;Decreased activity tolerance;Decreased balance;Decreased strength;Decreased endurance;Decreased knowledge of use of DME   Rehab Potential Good   PT Frequency 2x / week   PT Duration 8 weeks   PT Next Visit Plan Continue to address LE strengthening, functional endurance/activity tolerance, and standing balance. Trunk rotation  during gait. Gait training with SPC/without AD.   Consulted and Agree with Plan of Care Patient        Problem List Patient Active Problem List   Diagnosis Date Noted  . Upper airway cough syndrome 12/22/2013  . OTHER ACUTE SINUSITIS 10/18/2008  . ACUTE BRONCHITIS 10/18/2008  . SLEEP APNEA 01/11/2008  . DIABETES MELLITUS, TYPE II 08/30/2007  . HYPERLIPIDEMIA 08/30/2007  . HYPERTENSION 08/30/2007  . CONGESTIVE HEART FAILURE 08/30/2007  . ALLERGIC RHINITIS 08/30/2007  . OBSTRUCTIVE CHRONIC BRONCHITIS 08/30/2007  . ASTHMA 08/30/2007    Willow Ora 08/15/2015, 1:49 PM  Willow Ora, PTA, [facility] [address] [place], [place] [address] [phone] 08/15/2015, 1:49 PM   Name: Craig Wilcox MRN: [id number] Date of Birth: [birth year]

## 2015-08-16 ENCOUNTER — Ambulatory Visit: Payer: BLUE CROSS/BLUE SHIELD | Admitting: Occupational Therapy

## 2015-08-16 ENCOUNTER — Encounter: Payer: Self-pay | Admitting: Physical Therapy

## 2015-08-16 ENCOUNTER — Ambulatory Visit: Payer: BLUE CROSS/BLUE SHIELD | Admitting: Physical Therapy

## 2015-08-16 DIAGNOSIS — R531 Weakness: Secondary | ICD-10-CM

## 2015-08-16 DIAGNOSIS — R279 Unspecified lack of coordination: Secondary | ICD-10-CM

## 2015-08-16 DIAGNOSIS — R278 Other lack of coordination: Secondary | ICD-10-CM

## 2015-08-16 DIAGNOSIS — R269 Unspecified abnormalities of gait and mobility: Secondary | ICD-10-CM

## 2015-08-16 DIAGNOSIS — R2681 Unsteadiness on feet: Secondary | ICD-10-CM

## 2015-08-16 NOTE — Patient Instructions (Signed)
Calf / Gastoc: Runners' Stretch I    One leg back and straight, other forward and bent supporting weight, lean forward, gently stretching calf of back leg. Hold __20__ seconds. Repeat with other leg. Repeat _3___ times. Do __1-2__ sessions per day.  Copyright  VHI. All rights reserved.  Gastroc / Heel Cord Stretch - On Step    Using thick book or box: place both forefeet on box with heels on ground. Lean forward using a sturdy support, kitchen counter top for instance, until a stretch is felt in the calves. Hold 20 seconds. 3-5 reps each day.   Copyright  VHI. All rights reserved.

## 2015-08-16 NOTE — Therapy (Signed)
[facility] [address] [place], [place], [address] Phone: [phone]   Fax:  [phone]  Physical Therapy Treatment  Patient Details  Name: Craig Wilcox MRN: [id number] Date of Birth: February 09, 1956 No Data Recorded  Encounter Date: 08/16/2015   08/16/15 1321  PT Visits / Re-Eval  Visit Number 9  Number of Visits 17  Date for PT Re-Evaluation 09/09/15  Authorization  Authorization Type BCBS  PT Time Calculation  PT Start Time 1316  PT Stop Time 1400  PT Time Calculation (min) 44 min  PT - End of Session  Equipment Utilized During Treatment Gait belt  Activity Tolerance Patient tolerated treatment well  Behavior During Therapy [facility] for tasks assessed/performed     Past Medical History  Diagnosis Date  . Chronic kidney disease, stage V requiring chronic dialysis ([place])   . CHF (congestive heart failure) [facility])     Past Surgical History  Procedure Laterality Date  . Insertion of dialysis catheter      peritoneal  . Cardiac electrophysiology mapping and ablation      There were no vitals filed for this visit.  Visit Diagnosis:        Subjective Assessment - 08/16/15 1320    Subjective No new complaints. No falls or pain to report.   Pertinent History Goes by [name]". PMH: CKD, ESRD (performs peritoneal dialysis at home), CHF, DM II, COPD, HLD, gout, OSA, s/p osteomyelitis of L foot   Patient Stated Goals "Get back to work as soon as I can, get my endurance up, and be able to get up and walk around better."   Currently in Pain? No/denies   Pain Score 0-No pain           [facility] Adult PT Treatment/Exercise - 08/16/15 1333    Ambulation/Gait   Ambulation/Gait Yes   Ambulation/Gait Assistance 4: Min guard;5: Supervision   Ambulation Distance (Feet) 600 Feet   Assistive device Straight cane   Ambulation Surface Level;Unlevel;Indoor;Outdoor;Paved;Gravel;Grass     Neuro re-ed: Single leg stance  activities: With tall cones: alternating fwd toe taps, cross toe taps, fwd double toe taps, and cross double toe taps, and flip over/up x 10 each bil legs. Min guard assist to min assist for balance.           PT Short Term Goals - 08/09/15 2230    PT SHORT TERM GOAL #1   Title Pt will perform initial HEP with mod I using paper handout to maximize functional gains made in physical therapy. Target date: 08/08/15   Baseline Met 08/07/15   Status Achieved   PT SHORT TERM GOAL #2   Title Pt will increase 6 Minute Walk Test distance from 692' to 781' to indicate progress toward significant improvement in functional endurance. Target date: 08/08/15   Baseline 10/7: 6MWT = 979'   Status Achieved   PT SHORT TERM GOAL #3   Title Perform Berg Balance Scale to assess/address functional standing balance. Target date: 08/08/15   Baseline Per documentation on 9/20, baseline Berg score = 40/56.   Status Achieved   PT SHORT TERM GOAL #4   Title Pt will perform sit to stand transfer from standard chair without use of UE's or compensatory strategies to indicate improved functional LE strength. Target date: 08/08/15   Baseline Achieved 10/13.   Status Achieved           PT Long Term Goals - 08/03/15 1130    PT LONG TERM GOAL #1  Title Pt will improve Berg score by 8 points from baseline to indicate significant improvement in functional standing balance.  Target date: 09/05/15   Baseline 9/20: Baseline Berg score = 40/56   PT LONG TERM GOAL #2   Title Pt will increase 6 Minute Walk Test distance from 692' to 869' to indicate significant improvement in functional endurance.  Target date: 09/05/15   Baseline 10/7: 6MWT = 979'   Status Achieved   PT LONG TERM GOAL #3   Title Pt will independently ambulate 200' over level, indoor surfaces with effective obstacle negotiation to indicate independence with household mobility, progress toward return to PLOF.  Target date: 09/05/15   PT LONG TERM GOAL #4    Title Pt will ambulate x500' over paved, outdoor surfaces with mod I using LRAD to indicate increased safety with community mobility.  Target date: 09/05/15   PT LONG TERM GOAL #5   Title Pt will negotiate standard ramp and curb step with mod I using LRAD to indicate ability to safely traverse community obstacles.  Target date: 09/05/15   PT LONG TERM GOAL #6   Title Pt will improve ABC Scale from 0.6% to >14 % to indicate significant improvement in balance confidence. Target date: 09/05/15   PT LONG TERM GOAL #7   Title Pt will negotiate 7 stairs with single rail and mod I to enable to access all levels of home.  Target date: 09/05/15  Pt reports plan to install rails in home prior to moving home from sister's house        08/16/15 1322  Plan  Clinical Impression Statement Pt challanged with single leg stance activities. Making progress with gait using straight cane on all surfaces. Progressing well towards goals.  Pt will benefit from skilled therapeutic intervention in order to improve on the following deficits Abnormal gait;Cardiopulmonary status limiting activity;Decreased activity tolerance;Decreased balance;Decreased strength;Decreased endurance;Decreased knowledge of use of DME  Rehab Potential Good  PT Frequency 2x / week  PT Duration 8 weeks  PT Treatment/Interventions ADLs/Self Care Home Management;Vestibular;Manual techniques;Therapeutic activities;Therapeutic exercise;Balance training;Neuromuscular re-education;Gait training;Patient/family education;Functional mobility training;Orthotic Fit/Training;Stair training;DME Instruction  PT Next Visit Plan Continue to address LE strengthening, functional endurance/activity tolerance, and standing balance. Trunk rotation during gait. Gait training with SPC/without AD.  Consulted and Agree with Plan of Care Patient      Problem List Patient Active Problem List   Diagnosis Date Noted  . Upper airway cough syndrome 12/22/2013  . OTHER  ACUTE SINUSITIS 10/18/2008  . ACUTE BRONCHITIS 10/18/2008  . SLEEP APNEA 01/11/2008  . DIABETES MELLITUS, TYPE II 08/30/2007  . HYPERLIPIDEMIA 08/30/2007  . HYPERTENSION 08/30/2007  . CONGESTIVE HEART FAILURE 08/30/2007  . ALLERGIC RHINITIS 08/30/2007  . OBSTRUCTIVE CHRONIC BRONCHITIS 08/30/2007  . ASTHMA 08/30/2007    Willow Ora 08/16/2015, 1:47 PM  Willow Ora, PTA, [facility] [address], [place], [place] [address] [phone] 08/16/2015, 1:47 PM   Name: Craig Wilcox MRN: [id number] Date of Birth: Sep 04, 1956

## 2015-08-16 NOTE — Therapy (Signed)
[facility] [address] [place], [place], [address] Phone: [phone]   Fax:  [phone]  Occupational Therapy Treatment  Patient Details  Name: Craig Wilcox MRN: [id number] Date of Birth: [birth year] No Data Recorded  Encounter Date: 08/16/2015      OT End of Session - 08/16/15 1420    Visit Number 9   Number of Visits 17   Date for OT Re-Evaluation 09/08/15   Authorization Type BCBS   OT Start Time 4742   OT Stop Time 1445   OT Time Calculation (min) 40 min   Activity Tolerance Patient tolerated treatment well   Behavior During Therapy [facility] for tasks assessed/performed      Past Medical History  Diagnosis Date  . Chronic kidney disease, stage V requiring chronic dialysis ([place])   . CHF (congestive heart failure) [facility])     Past Surgical History  Procedure Laterality Date  . Insertion of dialysis catheter      peritoneal  . Cardiac electrophysiology mapping and ablation      There were no vitals filed for this visit.  Visit Diagnosis:  Weakness generalized  Decreased coordination      Subjective Assessment - 08/16/15 1410    Limitations see Epic   Patient Stated Goals to increase strength and coordination   Currently in Pain? Yes   Pain Score 3    Pain Location Knee   Pain Orientation Left   Pain Descriptors / Indicators Sore   Pain Type Acute pain   Pain Onset In the past 7 days   Pain Frequency Constant   Aggravating Factors  walking   Pain Relieving Factors unknown   Multiple Pain Sites No       Treatment: seated and standing to perform overhead reaching to place small pegs in pegboard while copying design, min difficulty. Reviewed green theraband exercises for increased strength, 15 reps each Shoulder flexion 2 lbs weight in each hand for shoulder flexion, 15 reps  Arm bike a 5 mins level 6                          OT Short Term Goals - 08/09/15 1422    OT  SHORT TERM GOAL #1   Title I with HEP.   Baseline due 08/09/15   Time 4   Period Weeks   Status Achieved   OT SHORT TERM GOAL #2   Title Pt will demonstrate an 8 lbs improvement in bilateral grip strength for increased functional use.   Baseline RUE 40, LUE 53   Time 4   Period Weeks   Status Achieved   OT SHORT TERM GOAL #3   Title Pt will perform light home management/ cooking task in standing with min A.   Baseline per pt report   Time 4   Period Weeks   Status Achieved   OT SHORT TERM GOAL #4   Title Pt will demonstrate ability to retrieve a light weight object  at 120 bilaterally.   Baseline met 120 for RUE, 130 for left   Time 4   Period Weeks   Status Achieved   OT SHORT TERM GOAL #5   Title Pt will increase RUE wrist extension to 55 * for increased functional use.   Time 4   Period Weeks   Status Achieved   OT SHORT TERM GOAL #6   Title Pt will perform all basic ADLs modified independently.   Baseline  per pt report   Time 4   Period Weeks   Status Achieved           OT Long Term Goals - 07/11/15 1634    OT LONG TERM GOAL #1   Title Pt will perform basic home management/ cooking at a walker level modified independently without LOB.   Baseline due 09/08/15   Time 8   Period Weeks   Status New   OT LONG TERM GOAL #2   Title Pt will demonstrate improved fine motor coordination as evidenced by decreasing RUE 9 hole peg test score to 32 secs or less.   Time 8   Period Weeks   Status New   OT LONG TERM GOAL #3   Title Pt will perform simulated work activities modified independently.   Time 8   Period Weeks   Status New   OT LONG TERM GOAL #4   Title Pt will demonstrate improved functional reach and standing balance as evidenced by perfoming 10 inches bilaterally on standing functional reach test.   Time 8   Period Weeks   Status New               Plan - 08/16/15 1421    Clinical Impression Statement Pt is progressing towards goals for  strength, endurance and coordination.   Pt will benefit from skilled therapeutic intervention in order to improve on the following deficits (Retired) Abnormal gait;Decreased coordination;Decreased range of motion;Difficulty walking;Decreased safety awareness;Decreased endurance;Decreased activity tolerance;Decreased knowledge of precautions;Pain;Impaired UE functional use;Decreased balance;Decreased mobility;Decreased strength   Rehab Potential Good   OT Frequency 2x / week   OT Duration 8 weeks   OT Treatment/Interventions Self-care/ADL training;Therapeutic exercise;Patient/family education;Splinting;Balance training;Manual Therapy;Neuromuscular education;Energy conservation;Therapeutic exercises;Therapeutic activities;DME and/or AE instruction;Parrafin;Cryotherapy;Electrical Stimulation;Fluidtherapy;Gait Training;Moist Heat;Contrast Bath;Passive range of motion   Plan coordination/ strength, dynamic standing balance   OT Home Exercise Plan issued green theraband   Consulted and Agree with Plan of Care Patient        Problem List Patient Active Problem List   Diagnosis Date Noted  . Upper airway cough syndrome 12/22/2013  . OTHER ACUTE SINUSITIS 10/18/2008  . ACUTE BRONCHITIS 10/18/2008  . SLEEP APNEA 01/11/2008  . DIABETES MELLITUS, TYPE II 08/30/2007  . HYPERLIPIDEMIA 08/30/2007  . HYPERTENSION 08/30/2007  . CONGESTIVE HEART FAILURE 08/30/2007  . ALLERGIC RHINITIS 08/30/2007  . OBSTRUCTIVE CHRONIC BRONCHITIS 08/30/2007  . ASTHMA 08/30/2007    Shirle Provencal 08/16/2015, 2:35 PM  [facility] [address] [place], [place], [address] Phone: [phone]   Fax:  [phone]  Name: Craig Wilcox MRN: [id number] Date of Birth: 01/07/1956

## 2015-08-21 ENCOUNTER — Ambulatory Visit: Payer: BLUE CROSS/BLUE SHIELD | Admitting: Physical Therapy

## 2015-08-21 ENCOUNTER — Ambulatory Visit: Payer: BLUE CROSS/BLUE SHIELD | Admitting: Occupational Therapy

## 2015-08-21 DIAGNOSIS — R269 Unspecified abnormalities of gait and mobility: Secondary | ICD-10-CM

## 2015-08-21 DIAGNOSIS — R531 Weakness: Secondary | ICD-10-CM

## 2015-08-21 DIAGNOSIS — R2681 Unsteadiness on feet: Secondary | ICD-10-CM

## 2015-08-21 DIAGNOSIS — R278 Other lack of coordination: Secondary | ICD-10-CM

## 2015-08-21 DIAGNOSIS — R279 Unspecified lack of coordination: Secondary | ICD-10-CM

## 2015-08-21 NOTE — Patient Instructions (Addendum)
Walking Program:  Begin walking for exercise without walker for 3 minutes, 2 times/day, 3-5 days/week.  Walk without your walker only if your son is standing next to you. Progress your walking program by adding 1 minute to your routine each week, as tolerated. Be sure to wear good walking shoes, walk in a safe environment (church gym would be ideal) and only progress to your tolerance.    Feet Apart (Compliant Surface) Head Motion - Eyes Open    With eyes open, standing on compliant surface with a stable chair in front of you, and feet shoulder width apart. Move head slowly x10 reps each: up/down; right/leg; diagonally up/right to down/left and vice versa.  Then, close your eyes and move head right to left x10 reps.    Lower Trunk Rotation with Head Turn     Feet and knees together, arms outstretched, rotate knees left, turning head in opposite direction, until stretch is felt. Hold __5__ seconds. Relax. Repeat _10___ times per set. Do _3___ sets per day.

## 2015-08-21 NOTE — Therapy (Signed)
[facility] [address] [place], [place], [address] Phone: [phone]   Fax:  [phone]  Occupational Therapy Treatment  Patient Details  Name: Craig Wilcox MRN: [id number] Date of Birth: 1956-02-22 No Data Recorded  Encounter Date: 08/21/2015      OT End of Session - 08/21/15 1124    Visit Number 10   Number of Visits 17   Date for OT Re-Evaluation 09/08/15   Authorization Type BCBS   OT Start Time 1104   OT Stop Time 1145   OT Time Calculation (min) 41 min   Activity Tolerance Patient tolerated treatment well   Behavior During Therapy [facility] for tasks assessed/performed      Past Medical History  Diagnosis Date  . Chronic kidney disease, stage V requiring chronic dialysis ([facility])   . CHF (congestive heart failure) [facility])     Past Surgical History  Procedure Laterality Date  . Insertion of dialysis catheter      peritoneal  . Cardiac electrophysiology mapping and ablation      There were no vitals filed for this visit.  Visit Diagnosis:  Weakness generalized  Decreased coordination  Unsteadiness      Subjective Assessment - 08/21/15 1109    Patient Stated Goals to increase strength and coordination   Currently in Pain? No/denies     Treatment: Dynamic standing balance activities with functional reach and trunk rotation, with bilateral UE's no LOB Arm bike x 8 mins level 7 for conditioning Supine shoulder flexion 2 sets of 12 with 3 lbs weight, followed by seated shoulder flexion to 90*, with 3 lbs weight in bilateral UE's  2 sets of 12 reps, min v.c. for positioning/ posture. Fine motor coordination task O'connor tweezer task for sustained pinch and dexterity, min v.c.                          OT Short Term Goals - 08/09/15 1422    OT SHORT TERM GOAL #1   Title I with HEP.   Baseline due 08/09/15   Time 4   Period Weeks   Status Achieved   OT SHORT TERM GOAL #2   Title Pt will demonstrate an 8 lbs improvement in bilateral grip strength for increased functional use.   Baseline RUE 40, LUE 53   Time 4   Period Weeks   Status Achieved   OT SHORT TERM GOAL #3   Title Pt will perform light home management/ cooking task in standing with min A.   Baseline per pt report   Time 4   Period Weeks   Status Achieved   OT SHORT TERM GOAL #4   Title Pt will demonstrate ability to retrieve a light weight object  at 120 bilaterally.   Baseline met 120 for RUE, 130 for left   Time 4   Period Weeks   Status Achieved   OT SHORT TERM GOAL #5   Title Pt will increase RUE wrist extension to 55 * for increased functional use.   Time 4   Period Weeks   Status Achieved   OT SHORT TERM GOAL #6   Title Pt will perform all basic ADLs modified independently.   Baseline per pt report   Time 4   Period Weeks   Status Achieved           OT Long Term Goals - 07/11/15 1634    OT LONG TERM GOAL #1  Title Pt will perform basic home management/ cooking at a walker level modified independently without LOB.   Baseline due 09/08/15   Time 8   Period Weeks   Status New   OT LONG TERM GOAL #2   Title Pt will demonstrate improved fine motor coordination as evidenced by decreasing RUE 9 hole peg test score to 32 secs or less.   Time 8   Period Weeks   Status New   OT LONG TERM GOAL #3   Title Pt will perform simulated work activities modified independently.   Time 8   Period Weeks   Status New   OT LONG TERM GOAL #4   Title Pt will demonstrate improved functional reach and standing balance as evidenced by perfoming 10 inches bilaterally on standing functional reach test.   Time 8   Period Weeks   Status New               Plan - 08/21/15 1110    Clinical Impression Statement Pt is progressing towards goals for strength and standing balance   Pt will benefit from skilled therapeutic intervention in order to improve on the following deficits (Retired)  Abnormal gait;Decreased coordination;Decreased range of motion;Difficulty walking;Decreased safety awareness;Decreased endurance;Decreased activity tolerance;Decreased knowledge of precautions;Pain;Impaired UE functional use;Decreased balance;Decreased mobility;Decreased strength   Rehab Potential Good   OT Frequency 2x / week   OT Duration 8 weeks   OT Treatment/Interventions Self-care/ADL training;Therapeutic exercise;Patient/family education;Splinting;Balance training;Manual Therapy;Neuromuscular education;Energy conservation;Therapeutic exercises;Therapeutic activities;DME and/or AE instruction;Parrafin;Cryotherapy;Electrical Stimulation;Fluidtherapy;Gait Training;Moist Heat;Contrast Bath;Passive range of motion   Plan coordination, strength , dynamic standing balance   OT Home Exercise Plan issued green theraband   Consulted and Agree with Plan of Care Patient        Problem List Patient Active Problem List   Diagnosis Date Noted  . Upper airway cough syndrome 12/22/2013  . OTHER ACUTE SINUSITIS 10/18/2008  . ACUTE BRONCHITIS 10/18/2008  . SLEEP APNEA 01/11/2008  . DIABETES MELLITUS, TYPE II 08/30/2007  . HYPERLIPIDEMIA 08/30/2007  . HYPERTENSION 08/30/2007  . CONGESTIVE HEART FAILURE 08/30/2007  . ALLERGIC RHINITIS 08/30/2007  . OBSTRUCTIVE CHRONIC BRONCHITIS 08/30/2007  . ASTHMA 08/30/2007    Craig Wilcox 08/21/2015, 11:25 AM [name], OTR/L [phone] Phone: [phone] 11:25 AM 08/21/2015 [facility] [address] [place], [place], [address] Phone: [phone]   Fax:  [phone]  Name: Craig Wilcox MRN: [id number] Date of Birth: October 06, 1956

## 2015-08-21 NOTE — Therapy (Signed)
[facility] [address] [place], [place], [address] Phone: [phone]   Fax:  [phone]  Physical Therapy Treatment  Patient Details  Name: Craig Wilcox MRN: [id number] Date of Birth: Dec 28, 1955 No Data Recorded  Encounter Date: 08/21/2015      PT End of Session - 08/21/15 1250    Visit Number 10   Number of Visits 17   Date for PT Re-Evaluation 09/09/15   Authorization Type BCBS   PT Start Time 1148   PT Stop Time 1233   PT Time Calculation (min) 45 min   Equipment Utilized During Treatment Gait belt   Activity Tolerance Patient limited by fatigue;Patient tolerated treatment well  Required frequent rest breaks when not using AD   Behavior During Therapy [facility] for tasks assessed/performed      Past Medical History  Diagnosis Date  . Chronic kidney disease, stage V requiring chronic dialysis ([place])   . CHF (congestive heart failure) [facility])     Past Surgical History  Procedure Laterality Date  . Insertion of dialysis catheter      peritoneal  . Cardiac electrophysiology mapping and ablation      There were no vitals filed for this visit.  Visit Diagnosis:  Abnormality of gait  Unsteadiness  Generalized weakness      Subjective Assessment - 08/21/15 1154    Subjective Pt saw nephrologist yesterday; appt went well. Pt now off of Warfarin. Pt reporting still having trouble walking uphill.   Pertinent History Goes by [name]". PMH: CKD, ESRD (performs peritoneal dialysis at home), CHF, DM II, COPD, HLD, gout, OSA, s/p osteomyelitis of L foot   Patient Stated Goals "Get back to work as soon as I can, get my endurance up, and be able to get up and walk around better."   Currently in Pain? No/denies                Vestibular Assessment - 08/21/15 0001    Symptom Behavior   Type of Dizziness Imbalance   Occulomotor Exam   Occulomotor Alignment Normal   Spontaneous Absent   Gaze-induced Absent    Smooth Pursuits Intact   Saccades Intact   Comment (+) L head thrust test                 [facility] Adult PT Treatment/Exercise - 08/21/15 0001    Ambulation/Gait   Ambulation/Gait Yes   Ambulation/Gait Assistance 5: Supervision   Ambulation/Gait Assistance Details x230' then x115' over level, indoor surfaces without AD with supervision; no overt LOB. Gait trials ended per pt request due to fatigue.   Ambulation Distance (Feet) 345 Feet   Assistive device None   Gait Pattern Step-through pattern;Decreased stride length;Trunk flexed;Decreased trunk rotation;Decreased hip/knee flexion - right;Decreased hip/knee flexion - left;Right flexed knee in stance   Ambulation Surface Level;Indoor   Gait Comments During gait, cueing focused on increasing reciprocal arm swing, trunk rotation for more normalized gait pattern.   Neuro Re-ed    Neuro Re-ed Details  See Balance Section for details.   Exercises   Exercises Other Exercises   Other Exercises  Lower trunk rotations 2 x10 reps (x5-sec holds) with cueing to progress by abducting B shoulders and performing concurrent contralateral neck rotation. See Pt Instructions for details.             Balance Exercises - 08/21/15 1242    Balance Exercises: Standing   Standing Eyes Opened Foam/compliant surface;Wide (BOA);Head turns;Other reps (comment)  10  reps horiz, vertical, diagonal with (S)   Standing Eyes Closed Wide (BOA);Foam/compliant surface;Head turns  (S) horizontal, min guard vertical/diagonal head turns (x10)           PT Education - 08/21/15 1239    Education provided Yes   Education Details HEP progressed/modified. Walk program (without AD) initiatd. See Pt Instructions.   Person(s) Educated Patient   Methods Explanation;Demonstration;Verbal cues;Handout   Comprehension Verbalized understanding;Returned demonstration          PT Short Term Goals - 08/09/15 2230    PT SHORT TERM GOAL #1   Title Pt will perform  initial HEP with mod I using paper handout to maximize functional gains made in physical therapy. Target date: 08/08/15   Baseline Met 08/07/15   Status Achieved   PT SHORT TERM GOAL #2   Title Pt will increase 6 Minute Walk Test distance from 692' to 781' to indicate progress toward significant improvement in functional endurance. Target date: 08/08/15   Baseline 10/7: 6MWT = 979'   Status Achieved   PT SHORT TERM GOAL #3   Title Perform Berg Balance Scale to assess/address functional standing balance. Target date: 08/08/15   Baseline Per documentation on 9/20, baseline Berg score = 40/56.   Status Achieved   PT SHORT TERM GOAL #4   Title Pt will perform sit to stand transfer from standard chair without use of UE's or compensatory strategies to indicate improved functional LE strength. Target date: 08/08/15   Baseline Achieved 10/13.   Status Achieved           PT Long Term Goals - 08/03/15 1130    PT LONG TERM GOAL #1   Title Pt will improve Berg score by 8 points from baseline to indicate significant improvement in functional standing balance.  Target date: 09/05/15   Baseline 9/20: Baseline Berg score = 40/56   PT LONG TERM GOAL #2   Title Pt will increase 6 Minute Walk Test distance from 692' to 869' to indicate significant improvement in functional endurance.  Target date: 09/05/15   Baseline 10/7: 6MWT = 979'   Status Achieved   PT LONG TERM GOAL #3   Title Pt will independently ambulate 200' over level, indoor surfaces with effective obstacle negotiation to indicate independence with household mobility, progress toward return to PLOF.  Target date: 09/05/15   PT LONG TERM GOAL #4   Title Pt will ambulate x500' over paved, outdoor surfaces with mod I using LRAD to indicate increased safety with community mobility.  Target date: 09/05/15   PT LONG TERM GOAL #5   Title Pt will negotiate standard ramp and curb step with mod I using LRAD to indicate ability to safely traverse  community obstacles.  Target date: 09/05/15   PT LONG TERM GOAL #6   Title Pt will improve ABC Scale from 0.6% to >14 % to indicate significant improvement in balance confidence. Target date: 09/05/15   PT LONG TERM GOAL #7   Title Pt will negotiate 7 stairs with single rail and mod I to enable to access all levels of home.  Target date: 09/05/15  Pt reports plan to install rails in home prior to moving home from [place] - 08/21/15 1251    Clinical Impression Statement Skilled session focused on gait training without AD, increasing trunk rotation, and decreasing visual/somatosensory input to increase pt reliance on vestibular input for balance.  Pt tolerated all interventions well but did require frequent rest breaks for standing/ambulation without AD, UE support.    Pt will benefit from skilled therapeutic intervention in order to improve on the following deficits Abnormal gait;Cardiopulmonary status limiting activity;Decreased activity tolerance;Decreased balance;Decreased strength;Decreased endurance;Decreased knowledge of use of DME   Rehab Potential Good   PT Frequency 2x / week   PT Duration 8 weeks   PT Treatment/Interventions ADLs/Self Care Home Management;Vestibular;Manual techniques;Therapeutic activities;Therapeutic exercise;Balance training;Neuromuscular re-education;Gait training;Patient/family education;Functional mobility training;Orthotic Fit/Training;Stair training;DME Instruction   PT Next Visit Plan Continue to address LE strengthening, functional endurance/activity tolerance, and standing balance. Balance training (decrease visual/somatosensory input). Trunk rotation during gait. Gait training with SPC/without AD.   Consulted and Agree with Plan of Care Patient        Problem List Patient Active Problem List   Diagnosis Date Noted  . Upper airway cough syndrome 12/22/2013  . OTHER ACUTE SINUSITIS 10/18/2008  . ACUTE BRONCHITIS 10/18/2008   . SLEEP APNEA 01/11/2008  . DIABETES MELLITUS, TYPE II 08/30/2007  . HYPERLIPIDEMIA 08/30/2007  . HYPERTENSION 08/30/2007  . CONGESTIVE HEART FAILURE 08/30/2007  . ALLERGIC RHINITIS 08/30/2007  . OBSTRUCTIVE CHRONIC BRONCHITIS 08/30/2007  . ASTHMA 08/30/2007    Billie Ruddy, PT, DPT [facility] [address] [place], [place], [address] Phone: [phone]   Fax:  [phone] 08/21/2015, 12:55 PM   Name: Ezekeil Bethel MRN: [id number] Date of Birth: [birth year]

## 2015-08-24 ENCOUNTER — Ambulatory Visit: Payer: BLUE CROSS/BLUE SHIELD | Admitting: Physical Therapy

## 2015-08-24 ENCOUNTER — Ambulatory Visit: Payer: BLUE CROSS/BLUE SHIELD | Admitting: Occupational Therapy

## 2015-08-24 DIAGNOSIS — R531 Weakness: Secondary | ICD-10-CM | POA: Diagnosis not present

## 2015-08-24 DIAGNOSIS — R279 Unspecified lack of coordination: Secondary | ICD-10-CM

## 2015-08-24 DIAGNOSIS — R278 Other lack of coordination: Secondary | ICD-10-CM

## 2015-08-24 DIAGNOSIS — R2681 Unsteadiness on feet: Secondary | ICD-10-CM

## 2015-08-24 DIAGNOSIS — R269 Unspecified abnormalities of gait and mobility: Secondary | ICD-10-CM

## 2015-08-24 NOTE — Therapy (Signed)
[facility] [address] [place], [place], [address] Phone: [phone]   Fax:  [phone]  Occupational Therapy Treatment  Patient Details  Name: Craig Wilcox MRN: [id number] Date of Birth: 1956/04/11 No Data Recorded  Encounter Date: 08/24/2015      OT End of Session - 08/24/15 1602    Visit Number 11   Number of Visits 17   Date for OT Re-Evaluation 09/08/15   Authorization Type BCBS   OT Start Time 1404   OT Stop Time 1445   OT Time Calculation (min) 41 min   Activity Tolerance Patient tolerated treatment well   Behavior During Therapy [facility] for tasks assessed/performed      Past Medical History  Diagnosis Date  . Chronic kidney disease, stage V requiring chronic dialysis ([place])   . CHF (congestive heart failure) [facility])     Past Surgical History  Procedure Laterality Date  . Insertion of dialysis catheter      peritoneal  . Cardiac electrophysiology mapping and ablation      There were no vitals filed for this visit.  Visit Diagnosis:  Weakness generalized  Decreased coordination  Unsteadiness      Subjective Assessment - 08/24/15 1413    Subjective  Pt reports he is cooking pancakes tomorrow   Limitations see Epic   Patient Stated Goals to increase strength and coordination   Currently in Pain? No/denies       Treatment: Digi- flex LUE green, RUE red and yellow for individual resisted finger flexion 10-20 reps  Gripper set at 55lbs, to pick up 1 inch blocks with left then right hands, 1 rest break each hand  Standing for dynamic functional reaching with right and left UE's and sustained pinch with trunk rotation, supervision to place remove graded clothespins (1-8 lbs resistance ) Arm bike x 5 mins level 7 for conditioning.                             OT Short Term Goals - 08/09/15 1422    OT SHORT TERM GOAL #1   Title I with HEP.   Baseline due 08/09/15   Time 4   Period Weeks   Status Achieved   OT SHORT TERM GOAL #2   Title Pt will demonstrate an 8 lbs improvement in bilateral grip strength for increased functional use.   Baseline RUE 40, LUE 53   Time 4   Period Weeks   Status Achieved   OT SHORT TERM GOAL #3   Title Pt will perform light home management/ cooking task in standing with min A.   Baseline per pt report   Time 4   Period Weeks   Status Achieved   OT SHORT TERM GOAL #4   Title Pt will demonstrate ability to retrieve a light weight object  at 120 bilaterally.   Baseline met 120 for RUE, 130 for left   Time 4   Period Weeks   Status Achieved   OT SHORT TERM GOAL #5   Title Pt will increase RUE wrist extension to 55 * for increased functional use.   Time 4   Period Weeks   Status Achieved   OT SHORT TERM GOAL #6   Title Pt will perform all basic ADLs modified independently.   Baseline per pt report   Time 4   Period Weeks   Status Achieved  OT Long Term Goals - 08/24/15 1423    OT LONG TERM GOAL #1   Title Pt will perform basic home management/ cooking at a walker level modified independently without LOB.   Baseline due 09/08/15   Time 8   Period Weeks   Status New   OT LONG TERM GOAL #2   Title Pt will demonstrate improved fine motor coordination as evidenced by decreasing RUE 9 hole peg test score to 32 secs or less.   Time 8   Period Weeks   Status New   OT LONG TERM GOAL #3   Title Pt will perform simulated work activities modified independently.   Time 8   Period Weeks   Status New   OT LONG TERM GOAL #4   Title Pt will demonstrate improved functional reach and standing balance as evidenced by perfoming 10 inches bilaterally on standing functional reach test.   Time 8   Period Weeks   Status New               Plan - 08/24/15 1423    Clinical Impression Statement Pt is progressing towards gaosl for standing balance and hand strength.   Pt will benefit from skilled  therapeutic intervention in order to improve on the following deficits (Retired) Abnormal gait;Decreased coordination;Decreased range of motion;Difficulty walking;Decreased safety awareness;Decreased endurance;Decreased activity tolerance;Decreased knowledge of precautions;Pain;Impaired UE functional use;Decreased balance;Decreased mobility;Decreased strength   Rehab Potential Good   OT Frequency 2x / week   OT Duration 8 weeks   OT Treatment/Interventions Self-care/ADL training;Therapeutic exercise;Patient/family education;Splinting;Balance training;Manual Therapy;Neuromuscular education;Energy conservation;Therapeutic exercises;Therapeutic activities;DME and/or AE instruction;Parrafin;Cryotherapy;Electrical Stimulation;Fluidtherapy;Gait Training;Moist Heat;Contrast Bath;Passive range of motion   Plan hand strength, dynamic balance, coordination   OT Home Exercise Plan issued green theraband   Consulted and Agree with Plan of Care Patient        Problem List Patient Active Problem List   Diagnosis Date Noted  . Upper airway cough syndrome 12/22/2013  . OTHER ACUTE SINUSITIS 10/18/2008  . ACUTE BRONCHITIS 10/18/2008  . SLEEP APNEA 01/11/2008  . DIABETES MELLITUS, TYPE II 08/30/2007  . HYPERLIPIDEMIA 08/30/2007  . HYPERTENSION 08/30/2007  . CONGESTIVE HEART FAILURE 08/30/2007  . ALLERGIC RHINITIS 08/30/2007  . OBSTRUCTIVE CHRONIC BRONCHITIS 08/30/2007  . ASTHMA 08/30/2007    Marianita Botkin 08/24/2015, 4:03 PM [name], OTR/L [phone] Phone: [phone] 4:03 PM 10/28/2016[facility] [address] [place], [place], [address] Phone: [phone]   Fax:  [phone]  Name: Craig Wilcox MRN: [id number] Date of Birth: [birth year]

## 2015-08-25 NOTE — Therapy (Signed)
[facility] [address] [place], [place], [address] Phone: [phone]   Fax:  [phone]  Physical Therapy Treatment  Patient Details  Name: Craig Wilcox MRN: [id number] Date of Birth: 06/05/1956 No Data Recorded  Encounter Date: 08/24/2015      PT End of Session - 08/25/15 1735    Visit Number 11   Number of Visits 17   Date for PT Re-Evaluation 09/09/15   Authorization Type BCBS   PT Start Time 1316   PT Stop Time 1400   PT Time Calculation (min) 44 min   Equipment Utilized During Treatment Gait belt   Activity Tolerance Patient limited by fatigue;Patient tolerated treatment well  frequent rest breaks required when not using AD   Behavior During Therapy [facility] for tasks assessed/performed      Past Medical History  Diagnosis Date  . Chronic kidney disease, stage V requiring chronic dialysis ([place])   . CHF (congestive heart failure) [facility])     Past Surgical History  Procedure Laterality Date  . Insertion of dialysis catheter      peritoneal  . Cardiac electrophysiology mapping and ablation      There were no vitals filed for this visit.  Visit Diagnosis:  Abnormality of gait  Weakness generalized  Generalized weakness      Subjective Assessment - 08/24/15 1325    Subjective Pt denies falls. Feels as though balance is much improved but endurance is limiting him significantly.   Pertinent History Goes by [name]". PMH: CKD, ESRD (performs peritoneal dialysis at home), CHF, DM II, COPD, HLD, gout, OSA, s/p osteomyelitis of L foot   Patient Stated Goals "Get back to work as soon as I can, get my endurance up, and be able to get up and walk around better."   Currently in Pain? No/denies                         [facility] Adult PT Treatment/Exercise - 08/25/15 0001    Ambulation/Gait   Ambulation/Gait Assistance Details --  total                PT Education - 08/24/15 1357    Education provided Yes   Education Details Berg score, progress, and functional implications. Pt now safe to ambulate in home and perform walking program without AD.    Person(s) Educated Patient   Methods Explanation   Comprehension Verbalized understanding          PT Short Term Goals - 08/09/15 2230    PT SHORT TERM GOAL #1   Title Pt will perform initial HEP with mod I using paper handout to maximize functional gains made in physical therapy. Target date: 08/08/15   Baseline Met 08/07/15   Status Achieved   PT SHORT TERM GOAL #2   Title Pt will increase 6 Minute Walk Test distance from 692' to 781' to indicate progress toward significant improvement in functional endurance. Target date: 08/08/15   Baseline 10/7: 6MWT = 979'   Status Achieved   PT SHORT TERM GOAL #3   Title Perform Berg Balance Scale to assess/address functional standing balance. Target date: 08/08/15   Baseline Per documentation on 9/20, baseline Berg score = 40/56.   Status Achieved   PT SHORT TERM GOAL #4   Title Pt will perform sit to stand transfer from standard chair without use of UE's or compensatory strategies to indicate improved functional LE strength. Target date: 08/08/15  Baseline Achieved 10/13.   Status Achieved           PT Long Term Goals - 08/24/15 1355    PT LONG TERM GOAL #1   Title Pt will improve Berg score by 8 points from baseline to indicate significant improvement in functional standing balance.  Target date: 09/05/15   Baseline 9/20: Baseline Berg score = 40/56;  10/28: Berg score =52/56.   Status Achieved   PT LONG TERM GOAL #2   Title Pt will increase 6 Minute Walk Test distance from 692' to 869' to indicate significant improvement in functional endurance.  Target date: 09/05/15   Baseline 10/7: 6MWT = 979'   Status Achieved   PT LONG TERM GOAL #3   Title Pt will independently ambulate 200' over level, indoor surfaces with effective obstacle negotiation to indicate  independence with household mobility, progress toward return to PLOF.  Target date: 09/05/15   Baseline Met 10/28.   Status Achieved   PT LONG TERM GOAL #4   Title Pt will ambulate x500' over paved, outdoor surfaces with mod I using LRAD to indicate increased safety with community mobility.  Target date: 09/05/15   PT LONG TERM GOAL #5   Title Pt will negotiate standard ramp and curb step with mod I using LRAD to indicate ability to safely traverse community obstacles.  Target date: 09/05/15   PT LONG TERM GOAL #6   Title Pt will improve ABC Scale from 0.6% to >14 % to indicate significant improvement in balance confidence. Target date: 09/05/15   PT LONG TERM GOAL #7   Title Pt will negotiate 7 stairs with single rail and mod I to enable to access all levels of home.  Target date: 09/05/15  Pt reports plan to install rails in home prior to moving home from [place] - 08/25/15 1736    Clinical Impression Statement Pt met LTG for Berg score, as score improved from 40 to 52/56 since evaluation, suggesting pt no longer at fall risk. Pt now safe to ambulate in home environment without AD; recommend pt continue to use RW in community due to decreased endurance without AD (able to ambulate for only 2 min, 40 sec without RW).   Pt will benefit from skilled therapeutic intervention in order to improve on the following deficits Abnormal gait;Cardiopulmonary status limiting activity;Decreased activity tolerance;Decreased balance;Decreased strength;Decreased endurance;Decreased knowledge of use of DME   Rehab Potential Good   PT Frequency 2x / week   PT Duration 8 weeks   PT Treatment/Interventions ADLs/Self Care Home Management;Vestibular;Manual techniques;Therapeutic activities;Therapeutic exercise;Balance training;Neuromuscular re-education;Gait training;Patient/family education;Functional mobility training;Orthotic Fit/Training;Stair training;DME Instruction   PT Next Visit  Plan Continue to address LE strengthening, functional endurance/activity tolerance without UE support/AD.   Consulted and Agree with Plan of Care Patient        Problem List Patient Active Problem List   Diagnosis Date Noted  . Upper airway cough syndrome 12/22/2013  . OTHER ACUTE SINUSITIS 10/18/2008  . ACUTE BRONCHITIS 10/18/2008  . SLEEP APNEA 01/11/2008  . DIABETES MELLITUS, TYPE II 08/30/2007  . HYPERLIPIDEMIA 08/30/2007  . HYPERTENSION 08/30/2007  . CONGESTIVE HEART FAILURE 08/30/2007  . ALLERGIC RHINITIS 08/30/2007  . OBSTRUCTIVE CHRONIC BRONCHITIS 08/30/2007  . ASTHMA 08/30/2007    Billie Ruddy, PT, DPT [facility] [address] [place], [place], [address] Phone: [phone]   Fax:  [phone] 08/25/2015, 5:40  PM   Name: Durrell Barajas MRN: [id number] Date of Birth: [birth year]

## 2015-08-28 ENCOUNTER — Ambulatory Visit: Payer: BLUE CROSS/BLUE SHIELD | Admitting: Occupational Therapy

## 2015-08-28 ENCOUNTER — Ambulatory Visit: Payer: BLUE CROSS/BLUE SHIELD | Attending: Physical Medicine and Rehabilitation | Admitting: Physical Therapy

## 2015-08-28 DIAGNOSIS — R279 Unspecified lack of coordination: Secondary | ICD-10-CM | POA: Diagnosis present

## 2015-08-28 DIAGNOSIS — R531 Weakness: Secondary | ICD-10-CM | POA: Insufficient documentation

## 2015-08-28 DIAGNOSIS — R2681 Unsteadiness on feet: Secondary | ICD-10-CM | POA: Insufficient documentation

## 2015-08-28 DIAGNOSIS — R269 Unspecified abnormalities of gait and mobility: Secondary | ICD-10-CM | POA: Diagnosis present

## 2015-08-28 DIAGNOSIS — R278 Other lack of coordination: Secondary | ICD-10-CM

## 2015-08-28 NOTE — Therapy (Signed)
[facility] [address] [place], [place], [address] Phone: [phone]   Fax:  [phone]  Physical Therapy Treatment  Patient Details  Name: Craig Wilcox MRN: [id number] Date of Birth: 06/06/1956 No Data Recorded  Encounter Date: 08/28/2015      PT End of Session - 08/28/15 1235    Visit Number 12   Number of Visits 17   PT Start Time 6962   PT Stop Time 1146   PT Time Calculation (min) 41 min   Equipment Utilized During Treatment Gait belt   Behavior During Therapy WFL for tasks assessed/performed      Past Medical History  Diagnosis Date  . Chronic kidney disease, stage V requiring chronic dialysis ([place])   . CHF (congestive heart failure) [facility])     Past Surgical History  Procedure Laterality Date  . Insertion of dialysis catheter      peritoneal  . Cardiac electrophysiology mapping and ablation      There were no vitals filed for this visit.  Visit Diagnosis:  Weakness generalized  Decreased coordination  Unsteadiness  Abnormality of gait  Generalized weakness      Subjective Assessment - 08/28/15 1219    Subjective Pt. denies falls.  Pt. reports a 2/10 pain right wrist that is improving, he say's, from this weekend.   Pt. reports that his balance is better but his endurance is limiting him.     Pain Score 2    Pain Location Wrist   Pain Orientation Right       In quadruped  -Alternating LE extensions with core stabilization (caution with R wrist; pt. Altered hand placement to avoid R wrist irritation)  In supine: -hook lying rotation dissociation of bil LE to R and L side.    In standing without AD: -Alternating cone toe-touch 10 each leg -Alternating rotational cone toe-touch to opposite side 2 x 5 (patient struggles with balance in R LE stance) -Alternating rotational rubber ball toe-touch 2 x 5 (pt. showed slight improvement from cones with lower target)  Gait/ambulation: -115  ft amb. Level surface/indoors supervision without AD trials x 3 with sitting rest break periods (pt. Was able to hold full conversations during gait with shortened stride length) -500 ft outdoor amb. with RW supervision (shortened stride length)        [facility] Adult PT Treatment/Exercise - 08/28/15 1223    Ambulation/Gait   Ambulation/Gait Yes   Ambulation/Gait Assistance 5: supervision (verbal cues)   Ambulation Distance (Feet) 855 Feet   Assistive device None   Gait Pattern Step-through pattern;Decreased stride length;Trunk flexed;Decreased trunk rotation;Decreased hip/knee flexion - right;Decreased hip/knee flexion - left;Right flexed knee in stance   Ambulation Surface Level;Indoor;Outdoor   Stairs No   Curb 5: Supervision   Curb Details (indicate cue type and reason) Pt. demo's proper technique, sequencing, and rate.               PT Short Term Goals - 08/09/15 2230    PT SHORT TERM GOAL #1   Title Pt will perform initial HEP with mod I using paper handout to maximize functional gains made in physical therapy. Target date: 08/08/15   Baseline Met 08/07/15   Status Achieved   PT SHORT TERM GOAL #2   Title Pt will increase 6 Minute Walk Test distance from 692' to 781' to indicate progress toward significant improvement in functional endurance. Target date: 08/08/15   Baseline 10/7: 6MWT = 979'   Status Achieved  PT SHORT TERM GOAL #3   Title Perform Berg Balance Scale to assess/address functional standing balance. Target date: 08/08/15   Baseline Per documentation on 9/20, baseline Berg score = 40/56.   Status Achieved   PT SHORT TERM GOAL #4   Title Pt will perform sit to stand transfer from standard chair without use of UE's or compensatory strategies to indicate improved functional LE strength. Target date: 08/08/15   Baseline Achieved 10/13.   Status Achieved           PT Long Term Goals - 08/24/15 1355    PT LONG TERM GOAL #1   Title Pt will improve Berg score  by 8 points from baseline to indicate significant improvement in functional standing balance.  Target date: 09/05/15   Baseline 9/20: Baseline Berg score = 40/56;  10/28: Berg score =52/56.   Status Achieved   PT LONG TERM GOAL #2   Title Pt will increase 6 Minute Walk Test distance from 692' to 869' to indicate significant improvement in functional endurance.  Target date: 09/05/15   Baseline 10/7: 6MWT = 979'   Status Achieved   PT LONG TERM GOAL #3   Title Pt will independently ambulate 200' over level, indoor surfaces with effective obstacle negotiation to indicate independence with household mobility, progress toward return to PLOF.  Target date: 09/05/15   Baseline Met 10/28.   Status Achieved   PT LONG TERM GOAL #4   Title Pt will ambulate x500' over paved, outdoor surfaces with mod I using LRAD to indicate increased safety with community mobility.  Target date: 09/05/15   PT LONG TERM GOAL #5   Title Pt will negotiate standard ramp and curb step with mod I using LRAD to indicate ability to safely traverse community obstacles.  Target date: 09/05/15   PT LONG TERM GOAL #6   Title Pt will improve ABC Scale from 0.6% to >14 % to indicate significant improvement in balance confidence. Target date: 09/05/15   PT LONG TERM GOAL #7   Title Pt will negotiate 7 stairs with single rail and mod I to enable to access all levels of home.  Target date: 09/05/15  Pt reports plan to install rails in home prior to moving home from [place] - 08/28/15 1236    Clinical Impression Statement Pt. responded well to increased volume of strength and balance training only showing minimal fatigue and shortness of breath.  Pt. R hip stability remains limited with LOB x 2 while in single leg stance phase.     Pt will benefit from skilled therapeutic intervention in order to improve on the following deficits Abnormal gait;Cardiopulmonary status limiting activity;Decreased activity  tolerance;Decreased balance;Decreased strength;Decreased endurance;Decreased knowledge of use of DME   Rehab Potential Good   PT Frequency 2x / week   PT Duration 8 weeks   PT Treatment/Interventions ADLs/Self Care Home Management;Vestibular;Manual techniques;Therapeutic activities;Therapeutic exercise;Balance training;Neuromuscular re-education;Gait training;Patient/family education;Functional mobility training;Orthotic Fit/Training;Stair training;DME Instruction   PT Next Visit Plan Continue to address LE strengthening, functional endurance/activity tolerance without UE support/AD.  Target R hip stability in stance with single leg activities.     Consulted and Agree with Plan of Care Patient        Problem List Patient Active Problem List   Diagnosis Date Noted  . Upper airway cough syndrome 12/22/2013  . OTHER ACUTE SINUSITIS 10/18/2008  . ACUTE BRONCHITIS 10/18/2008  . SLEEP APNEA 01/11/2008  .  DIABETES MELLITUS, TYPE II 08/30/2007  . HYPERLIPIDEMIA 08/30/2007  . HYPERTENSION 08/30/2007  . CONGESTIVE HEART FAILURE 08/30/2007  . ALLERGIC RHINITIS 08/30/2007  . OBSTRUCTIVE CHRONIC BRONCHITIS 08/30/2007  . ASTHMA 08/30/2007    Bess Harvest 08/29/2015, 11:43 AM  Bess Harvest, SPTA  Name: Craig Wilcox MRN: [id number] Date of Birth: Sep 27, 1956  This note has been reviewed and edited by supervision CI.  Willow Ora, PTA, [place] [address] [place], [place] [address] [phone] 08/29/2015, 12:15 PM

## 2015-08-28 NOTE — Therapy (Signed)
[facility] [address] [place], [place], [address] Phone: [phone]   Fax:  [phone]  Occupational Therapy Treatment  Patient Details  Name: Craig Wilcox MRN: [id number] Date of Birth: [birth year] No Data Recorded  Encounter Date: 08/28/2015      OT End of Session - 08/28/15 1206    Visit Number 12   Number of Visits 17   Date for OT Re-Evaluation 09/08/15   Authorization Type BC[employer]   OT Start Time 1149   OT Stop Time 1230   OT Time Calculation (min) 41 min   Activity Tolerance Patient tolerated treatment well   Behavior During Therapy [facility] for tasks assessed/performed      Past Medical History  Diagnosis Date  . Chronic kidney disease, stage V requiring chronic dialysis (HCC)   . CHF (congestive heart failure) [facility])     Past Surgical History  Procedure Laterality Date  . Insertion of dialysis catheter      peritoneal  . Cardiac electrophysiology mapping and ablation      There were no vitals filed for this visit.  Visit Diagnosis:  Weakness generalized  Decreased coordination  Unsteadiness      Subjective Assessment - 08/28/15 1153    Subjective  Pt reports that he is having pain in ulnar side of wrist that feels like tendonitis   Limitations see Epic   Patient Stated Goals to increase strength and coordination   Currently in Pain? Yes   Pain Score --  1-2/10   Pain Location Wrist   Pain Orientation Right   Pain Descriptors / Indicators Aching;Dull   Pain Type Acute pain   Aggravating Factors  use   Pain Relieving Factors unknown                      OT Treatments/Exercises (OP) - 08/28/15 0001    Exercises   Exercises --  Arm bike x71min level 7 for conditioning without rest    Hand Exercises   Other Hand Exercises Picking up blocks using 55lbs sustained grip strength (50% with RUE and 50% with LUE) with min difficutly.    Fine Motor Coordination   Fine Motor  Coordination O'Connor pegs   O'Connor pegs with min difficulty with R hand   Functional Reaching Activities   High Level Functional reaching to place small pegs in vertical pegboard with each hand for incr coordination, standing balance, and activity tolerance.                  OT Education - 08/28/15 1228    Education Details Wrist extension, RD stretch for wrist pain, use of ice prn   Person(s) Educated Patient   Methods Explanation   Comprehension Verbalized understanding;Returned demonstration          OT Short Term Goals - 08/09/15 1422    OT SHORT TERM GOAL #1   Title I with HEP.   Baseline due 08/09/15   Time 4   Period Weeks   Status Achieved   OT SHORT TERM GOAL #2   Title Pt will demonstrate an 8 lbs improvement in bilateral grip strength for increased functional use.   Baseline RUE 40, LUE 53   Time 4   Period Weeks   Status Achieved   OT SHORT TERM GOAL #3   Title Pt will perform light home management/ cooking task in standing with min A.   Baseline per pt report   Time 4  Period Weeks   Status Achieved   OT SHORT TERM GOAL #4   Title Pt will demonstrate ability to retrieve a light weight object  at 120 bilaterally.   Baseline met 120 for RUE, 130 for left   Time 4   Period Weeks   Status Achieved   OT SHORT TERM GOAL #5   Title Pt will increase RUE wrist extension to 55 * for increased functional use.   Time 4   Period Weeks   Status Achieved   OT SHORT TERM GOAL #6   Title Pt will perform all basic ADLs modified independently.   Baseline per pt report   Time 4   Period Weeks   Status Achieved           OT Long Term Goals - 08/24/15 1423    OT LONG TERM GOAL #1   Title Pt will perform basic home management/ cooking at a walker level modified independently without LOB.   Baseline due 09/08/15   Time 8   Period Weeks   Status New   OT LONG TERM GOAL #2   Title Pt will demonstrate improved fine motor coordination as evidenced by  decreasing RUE 9 hole peg test score to 32 secs or less.   Time 8   Period Weeks   Status New   OT LONG TERM GOAL #3   Title Pt will perform simulated work activities modified independently.   Time 8   Period Weeks   Status New   OT LONG TERM GOAL #4   Title Pt will demonstrate improved functional reach and standing balance as evidenced by perfoming 10 inches bilaterally on standing functional reach test.   Time 8   Period Weeks   Status New               Plan - 08/28/15 1206    Clinical Impression Statement Pt continues to progress towards goals.  Pt reports ulnar wrist pain after last session, but improved now (decr further by end of session)   Plan hand strength, dynamic balance, coordination   OT Home Exercise Plan issued green theraband   Consulted and Agree with Plan of Care Patient        Problem List Patient Active Problem List   Diagnosis Date Noted  . Upper airway cough syndrome 12/22/2013  . OTHER ACUTE SINUSITIS 10/18/2008  . ACUTE BRONCHITIS 10/18/2008  . SLEEP APNEA 01/11/2008  . DIABETES MELLITUS, TYPE II 08/30/2007  . HYPERLIPIDEMIA 08/30/2007  . HYPERTENSION 08/30/2007  . CONGESTIVE HEART FAILURE 08/30/2007  . ALLERGIC RHINITIS 08/30/2007  . OBSTRUCTIVE CHRONIC BRONCHITIS 08/30/2007  . ASTHMA 08/30/2007    [facility] 08/28/2015, 1:03 PM  [place] [address] [place], [place], [address] Phone: [phone]   Fax:  [phone]  Name: Craig Wilcox MRN: [id number] Date of Birth: [birth year]   Vianne Bulls, OTR/L 08/28/2015 1:03 PM

## 2015-08-30 ENCOUNTER — Encounter: Payer: Self-pay | Admitting: Physical Therapy

## 2015-08-30 ENCOUNTER — Ambulatory Visit: Payer: BLUE CROSS/BLUE SHIELD | Admitting: Physical Therapy

## 2015-08-30 ENCOUNTER — Ambulatory Visit: Payer: BLUE CROSS/BLUE SHIELD | Admitting: Occupational Therapy

## 2015-08-30 ENCOUNTER — Emergency Department (HOSPITAL_COMMUNITY)
Admission: EM | Admit: 2015-08-30 | Discharge: 2015-09-27 | Disposition: E | Payer: BLUE CROSS/BLUE SHIELD | Attending: Emergency Medicine | Admitting: Emergency Medicine

## 2015-08-30 ENCOUNTER — Encounter (HOSPITAL_COMMUNITY): Payer: Self-pay | Admitting: Emergency Medicine

## 2015-08-30 DIAGNOSIS — R2681 Unsteadiness on feet: Secondary | ICD-10-CM

## 2015-08-30 DIAGNOSIS — I469 Cardiac arrest, cause unspecified: Secondary | ICD-10-CM | POA: Insufficient documentation

## 2015-08-30 DIAGNOSIS — R531 Weakness: Secondary | ICD-10-CM

## 2015-08-30 DIAGNOSIS — R269 Unspecified abnormalities of gait and mobility: Secondary | ICD-10-CM

## 2015-08-30 DIAGNOSIS — Z7952 Long term (current) use of systemic steroids: Secondary | ICD-10-CM | POA: Insufficient documentation

## 2015-08-30 DIAGNOSIS — Z79899 Other long term (current) drug therapy: Secondary | ICD-10-CM | POA: Insufficient documentation

## 2015-08-30 DIAGNOSIS — R278 Other lack of coordination: Secondary | ICD-10-CM

## 2015-08-30 DIAGNOSIS — R279 Unspecified lack of coordination: Secondary | ICD-10-CM

## 2015-08-30 DIAGNOSIS — Z992 Dependence on renal dialysis: Secondary | ICD-10-CM | POA: Insufficient documentation

## 2015-08-30 DIAGNOSIS — N186 End stage renal disease: Secondary | ICD-10-CM | POA: Insufficient documentation

## 2015-08-30 DIAGNOSIS — Z7901 Long term (current) use of anticoagulants: Secondary | ICD-10-CM | POA: Insufficient documentation

## 2015-08-30 MED ORDER — CALCIUM CHLORIDE 10 % IV SOLN
INTRAVENOUS | Status: AC | PRN
Start: 2015-08-30 — End: 2015-08-30
  Administered 2015-08-30: 1 g via INTRAVENOUS

## 2015-08-30 MED ORDER — SODIUM BICARBONATE 8.4 % IV SOLN
INTRAVENOUS | Status: AC | PRN
  Administered 2015-08-30: 50 meq via INTRAVENOUS

## 2015-08-30 MED ORDER — EPINEPHRINE HCL 0.1 MG/ML IJ SOSY
PREFILLED_SYRINGE | INTRAMUSCULAR | Status: AC | PRN
  Administered 2015-08-30: 1 via INTRAVENOUS

## 2015-09-03 ENCOUNTER — Encounter: Payer: Self-pay | Admitting: Physical Therapy

## 2015-09-03 NOTE — Therapy (Signed)
[place] [address] [place], [place], [address] Phone: [phone]   Fax:  [phone]  Patient Details  Name: Craig Wilcox MRN: [id number] Date of Birth: [birth year] Referring Lillyian Heidt:  Estevan Ryder, MD Encounter Date: 09/03/2015  PHYSICAL THERAPY DISCHARGE SUMMARY  Visits from Start of Care: 13  Current functional level related to goals / functional outcomes: N/A      PT Long Term Goals - 08/24/15 1355    PT LONG TERM GOAL #1   Title Pt will improve Berg score by 8 points from baseline to indicate significant improvement in functional standing balance.  Target date: 09/05/15   Baseline 9/20: Baseline Berg score = 40/56;  10/28: Berg score =52/56.   Status Achieved   PT LONG TERM GOAL #2   Title Pt will increase 6 Minute Walk Test distance from 692' to 869' to indicate significant improvement in functional endurance.  Target date: 09/05/15   Baseline 10/7: 6MWT = 979'   Status Achieved   PT LONG TERM GOAL #3   Title Pt will independently ambulate 200' over level, indoor surfaces with effective obstacle negotiation to indicate independence with household mobility, progress toward return to PLOF.  Target date: 09/05/15   Baseline Met 10/28.   Status Achieved   PT LONG TERM GOAL #4   Title Pt will ambulate x500' over paved, outdoor surfaces with mod I using LRAD to indicate increased safety with community mobility.  Target date: 09/05/15   PT LONG TERM GOAL #5   Title Pt will negotiate standard ramp and curb step with mod I using LRAD to indicate ability to safely traverse community obstacles.  Target date: 09/05/15   PT LONG TERM GOAL #6   Title Pt will improve ABC Scale from 0.6% to >14 % to indicate significant improvement in balance confidence. Target date: 09/05/15   PT LONG TERM GOAL #7   Title Pt will negotiate 7 stairs with single rail and mod I to enable to access all levels of home.  Target date: 09/05/15  Pt  reports plan to install rails in home prior to moving home from sister's house        Remaining deficits: N/A   Education / Equipment: HEP, walking program  Plan: Skilled PT services discontinued, as patient deceased.       Billie Ruddy, PT, DPT [facility] [address] [place], [place], [address] Phone: [phone]   Fax:  [phone] 09/03/2015, 9:42 PM

## 2015-09-04 ENCOUNTER — Ambulatory Visit: Payer: BLUE CROSS/BLUE SHIELD | Admitting: Physical Therapy

## 2015-09-04 ENCOUNTER — Encounter: Payer: BLUE CROSS/BLUE SHIELD | Admitting: Occupational Therapy

## 2015-09-07 ENCOUNTER — Ambulatory Visit: Payer: BLUE CROSS/BLUE SHIELD | Admitting: Physical Therapy

## 2015-09-07 ENCOUNTER — Encounter: Payer: BLUE CROSS/BLUE SHIELD | Admitting: Occupational Therapy

## 2015-09-27 NOTE — ED Notes (Signed)
Pt here from home cpr in progress found by family lying flat in room fire dept started cpr on arrival shocked with aed times 2 , ems shocked three times due to v fib , pt received 9 epi by ems 300 amirodione , 1 gm ca and 50 sodium bicarb, pt then went into pea for 10 mins, and asystole 20 mins pt had io in left shin.

## 2015-09-27 NOTE — ED Provider Notes (Signed)
CSN: [id number]     Arrival date & time 09/12/2015  1746 History   First MD Initiated Contact with Patient 03-30-15 1756     Chief complaint: Cardiac arrest HPI Patient presents to the emergency room after acute cardiac arrest. According to the EMS report, the patient had a witnessed episode where he suddenly fell backward and became unresponsive. Family did not initiate CPR but they immediately called 911. Paramedics arrived and initiated CPR. Patient was found to be in ventricular fibrillation.  He received 3 defibrillations. He was also given epinephrine after EMS arrival. Patient has a history of chronic kidney disease and peritoneal dialysis so they also gave him amiodarone, calcium and sodium bicarbonate.  Patient progressed into PDA and eventually an asystolic rhythm. By the time EMS arrived in the emergency Department he had received over an hour of CPR. The patient remains pulseless. Past Medical History  Diagnosis Date  . Chronic kidney disease, stage V requiring chronic dialysis (HCC)   . CHF (congestive heart failure) [facility](HCC)    Past Surgical History  Procedure Laterality Date  . Insertion of dialysis catheter      peritoneal  . Cardiac electrophysiology mapping and ablation     Family History  Problem Relation Age of Onset  . Stroke Mother   . Lymphoma Father   . Diabetes Mother    Social History  Substance Use Topics  . Smoking status: Never Smoker   . Smokeless tobacco: None  . Alcohol Use: No    Review of Systems  Unable to perform ROS: Acuity of condition      Allergies  Erythromycin  Home Medications   Prior to Admission medications   Medication Sig Start Date End Date Taking? Authorizing Provider  bisoprolol (ZEBETA) 5 MG tablet Take 1 tablet (5 mg total) by mouth daily. 12/21/13   Nyoka CowdenMichael B Wert, MD  calcitRIOL (ROCALTROL) 0.25 MCG capsule Take 1 capsule by mouth. 1 cap three times per wk    Historical Provider, MD  famotidine (PEPCID) 20 MG tablet One at  bedtime 01/18/14   Nyoka CowdenMichael B Wert, MD  hydrALAZINE (APRESOLINE) 25 MG tablet Take 25 mg by mouth daily as needed.    Historical Provider, MD  indomethacin (INDOCIN SR) 75 MG CR capsule Take 1 capsule by mouth as needed.    Historical Provider, MD  pantoprazole (PROTONIX) 40 MG tablet Take 1 tablet (40 mg total) by mouth daily. Take 30-60 min before first meal of the day 01/18/14   Nyoka CowdenMichael B Wert, MD  predniSONE (DELTASONE) 10 MG tablet Take  4 each am x 2 days,   2 each am x 2 days,  1 each am x 2 days and stop 01/18/14   Nyoka CowdenMichael B Wert, MD  RENVELA 800 MG tablet Take 4 tablets by mouth. With every meal    Historical Provider, MD  warfarin (COUMADIN) 1 MG tablet Take 1 mg by mouth 2 (two) times daily. Pt reports dosage is 4mg     Rowe PavyJames R Dilley, MD   There were no vitals taken for this visit. Physical Exam  Constitutional: He has a sickly appearance.  HENT:  Head: Head is without raccoon's eyes and without Battle's sign.  Patient has a King airway in place, and the sister breathing around the mouth  Eyes:  Pupils are non-reactive  Neck: No tracheal deviation present. No thyroid mass present.  Cardiovascular:  Absent heart sounds  Pulmonary/Chest:  Equal breath sounds with bag valve ventilation  Abdominal:  Abdomen is  protuberant, peritoneal catheter in place  Genitourinary: Uncircumcised.  Musculoskeletal:  No edema and no evidence of trauma  Neurological: He is unresponsive. GCS eye subscore is 1. GCS verbal subscore is 1. GCS motor subscore is 1.  Skin:  No rashes    ED Course  Procedures (including critical care time) CRITICAL CARE Performed by: WGNFA,OZH Total critical care time: 20 minutes Critical care time was exclusive of separately billable procedures and treating other patients. Critical care was necessary to treat or prevent imminent or life-threatening deterioration. Critical care was time spent personally by me on the following activities: development of treatment plan  with patient and/or surrogate as well as nursing, discussions with consultants, evaluation of patient's response to treatment, examination of patient, obtaining history from patient or surrogate, ordering and performing treatments and interventions, ordering and review of laboratory studies, ordering and review of radiographic studies, pulse oximetry and re-evaluation of patient's condition.   MDM   Final diagnoses:  Cardiac arrest [facility])   Patient presented in asystole. CPR was continued.  Patient was given an additional dose of sodium bicarbonate and was also given calcium chloride. With an additional dose of epinephrine was given. The patient remained in asystole.  Bedside cardiac ultrasound did not demonstrate any cardiac activity. CPR was discontinued.      Linwood Dibbles, MD 08/31/15 1254

## 2015-09-27 NOTE — Progress Notes (Signed)
   09-30-15 1855  Clinical Encounter Type  Visited With Patient and family together;Health care provider  Visit Type Initial;Death  Referral From Nurse  Stress Factors  Family Stress Factors Loss   Chaplain responded to a post-CPR event and death in the ED. Chaplain offered support to the patient's family, gave information about patient placement, and offered hospitality. Chaplain support available as needed.   Alda Ponderdam M Rhys Anchondo, Chaplain 2015-04-03 6:56 PM

## 2015-09-27 NOTE — ED Notes (Signed)
Family remains at bedside.

## 2015-09-27 NOTE — ED Notes (Signed)
Pt's King airway, Left tibial IO, and left IV removed. Pt transported to morgue, escorted by security.

## 2015-09-27 NOTE — Therapy (Signed)
[place] [address] [place], [place], [address] Phone: [phone]   Fax:  [phone]  Physical Therapy Treatment  Patient Details  Name: Craig Wilcox MRN: [id number] Date of Birth: [birth year] No Data Recorded  Encounter Date: Sep 29, 2015      PT End of Session - 09-29-2015 1419    Visit Number 13   Number of Visits 17   PT Start Time 1316   PT Stop Time 1400   PT Time Calculation (min) 44 min   Equipment Utilized During Treatment Gait belt   Activity Tolerance Patient tolerated treatment well   Behavior During Therapy [facility] for tasks assessed/performed      Past Medical History  Diagnosis Date  . Chronic kidney disease, stage V requiring chronic dialysis ([facility])   . CHF (congestive heart failure) [facility])     Past Surgical History  Procedure Laterality Date  . Insertion of dialysis catheter      peritoneal  . Cardiac electrophysiology mapping and ablation      There were no vitals filed for this visit.  Visit Diagnosis:  Weakness generalized  Decreased coordination  Unsteadiness  Abnormality of gait  Generalized weakness      Subjective Assessment - 09/29/2015 1412    Subjective Reports mild pain at the left wrist 2/10.      Currently in Pain? Yes   Pain Score 2    Pain Location Wrist   Pain Orientation Left     Neuro re-ed (to increase neuromuscular facilitation, coordination, and balance to increase safety):  In standing without AD: -Alternating cone toe-touch 10 each leg; min guard provided for safety.  -Alternating rotational cone toe-touch to opposite side 2 x 5; min guard provided for safety (patient struggles with balance in R LE stance) -Alternating rotational rubber ball toe-touch 10 each leg; min guard provided for safety (pt. showed slight improvement from cones with lower target) (verbal cues provided for control and decreased speed) -alternating rotational rubber ball toe-touch 10  each leg on blue foam mat; min guard provided for safety (pt. Showed diminished coordination and control with rotation and addition of blue foam mat) In parallel bars: -Single leg stance 1 UE support on blue foam mat (30 sec each leg); min guard provided for safety (verbal cues provided for upright posture) -double leg stance on balance board forward/backward (30 sec each way); min guard provided for safety (verbal cues provided for upright posture)         [facility] Adult PT Treatment/Exercise - 29-Sep-2015 1417    Ambulation/Gait   Ambulation/Gait Yes   Ambulation/Gait Assistance supervision   Ambulation/Gait Assistance Details pt. continues to exhibit short stride length, forward flexed posture, narrow base of support, deminished arm swing R and L; verbal cues given to increased LE clearance bilaterally; pt. required frequent rest breaks ~ 200 ft initially; without AD and progressively required standing rest breaks more freqeuntly with less distance covered on both level surface outdoor/indoor    Ambulation Distance (Feet) 745 Feet   Assistive device None   Gait Pattern Step-through pattern;Decreased stride length;Trunk flexed;Decreased trunk rotation;Decreased hip/knee flexion - right;Decreased hip/knee flexion - left;Right flexed knee in stance   Stairs Yes   Stairs Assistance 4: Min guard;5: Supervision   Stairs Assistance Details (indicate cue type and reason) min guard; verbal cues to only use single rail on R and for upright posture    Stair Management Technique One rail Right;Forwards;Step to pattern   Number of Stairs 8  Height of Stairs 6            PT Short Term Goals - 08/09/15 2230    PT SHORT TERM GOAL #1   Title Pt will perform initial HEP with mod I using paper handout to maximize functional gains made in physical therapy. Target date: 08/08/15   Baseline Met 08/07/15   Status Achieved   PT SHORT TERM GOAL #2   Title Pt will increase 6 Minute Walk Test distance from  692' to 781' to indicate progress toward significant improvement in functional endurance. Target date: 08/08/15   Baseline 10/7: 6MWT = 979'   Status Achieved   PT SHORT TERM GOAL #3   Title Perform Berg Balance Scale to assess/address functional standing balance. Target date: 08/08/15   Baseline Per documentation on 9/20, baseline Berg score = 40/56.   Status Achieved   PT SHORT TERM GOAL #4   Title Pt will perform sit to stand transfer from standard chair without use of UE's or compensatory strategies to indicate improved functional LE strength. Target date: 08/08/15   Baseline Achieved 10/13.   Status Achieved           PT Long Term Goals - 08/24/15 1355    PT LONG TERM GOAL #1   Title Pt will improve Berg score by 8 points from baseline to indicate significant improvement in functional standing balance.  Target date: 09/05/15   Baseline 9/20: Baseline Berg score = 40/56;  10/28: Berg score =52/56.   Status Achieved   PT LONG TERM GOAL #2   Title Pt will increase 6 Minute Walk Test distance from 692' to 869' to indicate significant improvement in functional endurance.  Target date: 09/05/15   Baseline 10/7: 6MWT = 979'   Status Achieved   PT LONG TERM GOAL #3   Title Pt will independently ambulate 200' over level, indoor surfaces with effective obstacle negotiation to indicate independence with household mobility, progress toward return to PLOF.  Target date: 09/05/15   Baseline Met 10/28.   Status Achieved   PT LONG TERM GOAL #4   Title Pt will ambulate x500' over paved, outdoor surfaces with mod I using LRAD to indicate increased safety with community mobility.  Target date: 09/05/15   PT LONG TERM GOAL #5   Title Pt will negotiate standard ramp and curb step with mod I using LRAD to indicate ability to safely traverse community obstacles.  Target date: 09/05/15   PT LONG TERM GOAL #6   Title Pt will improve ABC Scale from 0.6% to >14 % to indicate significant improvement in  balance confidence. Target date: 09/05/15   PT LONG TERM GOAL #7   Title Pt will negotiate 7 stairs with single rail and mod I to enable to access all levels of home.  Target date: 09/05/15  Pt reports plan to install rails in home prior to moving home from [place] - 2015-09-17 1424    Clinical Impression Statement Pt. tolerated increased activity level well; continues to be limited by shortness of breath requiring frequent rest breaks.  pt. showed improvement with single leg balance activities with rotation.      Pt will benefit from skilled therapeutic intervention in order to improve on the following deficits Abnormal gait;Cardiopulmonary status limiting activity;Decreased activity tolerance;Decreased balance;Decreased strength;Decreased endurance;Decreased knowledge of use of DME   Rehab Potential Good   PT Frequency 2x /  week   PT Duration 8 weeks   PT Treatment/Interventions ADLs/Self Care Home Management;Vestibular;Manual techniques;Therapeutic activities;Therapeutic exercise;Balance training;Neuromuscular re-education;Gait training;Patient/family education;Functional mobility training;Orthotic Fit/Training;Stair training;DME Instruction   PT Next Visit Plan Continue to address LE strengthening, functional endurance/activity tolerance without UE support/AD.  Target R hip stability in stance with single leg activities.     Consulted and Agree with Plan of Care Patient        Problem List Patient Active Problem List   Diagnosis Date Noted  . Upper airway cough syndrome 12/22/2013  . OTHER ACUTE SINUSITIS 10/18/2008  . ACUTE BRONCHITIS 10/18/2008  . SLEEP APNEA 01/11/2008  . DIABETES MELLITUS, TYPE II 08/30/2007  . HYPERLIPIDEMIA 08/30/2007  . HYPERTENSION 08/30/2007  . CONGESTIVE HEART FAILURE 08/30/2007  . ALLERGIC RHINITIS 08/30/2007  . OBSTRUCTIVE CHRONIC BRONCHITIS 08/30/2007  . ASTHMA 08/30/2007    [place] 09-28-15, 2:40 PM  [place], [place]  Name: Craig Wilcox MRN: [id number] Date of Birth: May 27, 1956  This note has been reviewed and edited by supervision CI.  Willow Ora, PTA, [place] [address], [place], [place] [address] [phone] 08/31/2015, 1:52 PM

## 2015-09-27 NOTE — Therapy (Signed)
[facility] [address] [place], [place], [address] Phone: [phone]   Fax:  [phone]  Occupational Therapy Treatment  Patient Details  Name: Craig Wilcox MRN: [id number] Date of Birth: [birth year] No Data Recorded  Encounter Date: 09/03/15    Past Medical History  Diagnosis Date  . Chronic kidney disease, stage V requiring chronic dialysis ([facility])   . CHF (congestive heart failure) [facility])     Past Surgical History  Procedure Laterality Date  . Insertion of dialysis catheter      peritoneal  . Cardiac electrophysiology mapping and ablation      There were no vitals filed for this visit.  Visit Diagnosis:  Weakness generalized  Decreased coordination  Unsteadiness          Treatment: Standing to place washes of various sizes on dowels with RUE for dynamic standing balance and fine motor coordination. Typing activities for increased fine motor coordination, min v.c./ difficulty. Strengthening exercises in prep for d/c next week: Shoulder flexion with 3 lbs, biceps curls with 5 lbs,  Shoulder abduct 2 sets of 10 reps without weight, biceps curls 2 sets of 10 reps with 5 lbs bilaterally. Pt agrees with plans to d/c next week.                   OT Short Term Goals - Sep 03, 2015 1420    OT SHORT TERM GOAL #1   Title I with HEP.   Baseline due 08/09/15   Time 4   Period Weeks   Status Achieved   OT SHORT TERM GOAL #2   Title Pt will demonstrate an 8 lbs improvement in bilateral grip strength for increased functional use.   Baseline on 09/03/15 grip strength RUE 75 lbs   Time 4   Period Weeks   Status Achieved   OT SHORT TERM GOAL #3   Title Pt will perform light home management/ cooking task in standing with min A.   Baseline per pt report   Time 4   Period Weeks   Status Achieved   OT SHORT TERM GOAL #4   Title Pt will demonstrate ability to retrieve a light weight object  at 120  bilaterally.   Baseline met 120 for RUE, 130 for left   Time 4   Period Weeks   Status Achieved   OT SHORT TERM GOAL #5   Title Pt will increase RUE wrist extension to 55 * for increased functional use.   Time 4   Period Weeks   Status Achieved   OT SHORT TERM GOAL #6   Title Pt will perform all basic ADLs modified independently.   Baseline per pt report   Time 4   Period Weeks   Status Achieved           OT Long Term Goals - 03-Sep-2015 1422    OT LONG TERM GOAL #1   Title Pt will perform basic home management/ cooking at a walker level modified independently without LOB.   Baseline due 09/08/15   Time 8   Period Weeks   Status New   OT LONG TERM GOAL #2   Title Pt will demonstrate improved fine motor coordination as evidenced by decreasing RUE 9 hole peg test score to 32 secs or less.   Time 8   Period Weeks   Status New   OT LONG TERM GOAL #3   Title Pt will perform simulated work activities modified independently.   Time  8   Period Weeks   Status New   OT LONG TERM GOAL #4   Title Pt will demonstrate improved functional reach and standing balance as evidenced by perfoming 10 inches bilaterally on standing functional reach test.   Time 8   Period Weeks   Status New               Plan - 09-24-15 1417    Clinical Impression Statement Pt reports that wrist pain has resolved. Pt is progressing towards goals.   Rehab Potential Good   OT Frequency 2x / week   OT Duration 8 weeks   OT Treatment/Interventions Self-care/ADL training;Therapeutic exercise;Patient/family education;Splinting;Balance training;Manual Therapy;Neuromuscular education;Energy conservation;Therapeutic exercises;Therapeutic activities;DME and/or AE instruction;Parrafin;Cryotherapy;Electrical Stimulation;Fluidtherapy;Gait Training;Moist Heat;Contrast Bath;Passive range of motion   Plan hand strength, dynamic standing balance   Consulted and Agree with Plan of Care Patient        Problem  List Patient Active Problem List   Diagnosis Date Noted  . Upper airway cough syndrome 12/22/2013  . OTHER ACUTE SINUSITIS 10/18/2008  . ACUTE BRONCHITIS 10/18/2008  . SLEEP APNEA 01/11/2008  . DIABETES MELLITUS, TYPE II 08/30/2007  . HYPERLIPIDEMIA 08/30/2007  . HYPERTENSION 08/30/2007  . CONGESTIVE HEART FAILURE 08/30/2007  . ALLERGIC RHINITIS 08/30/2007  . OBSTRUCTIVE CHRONIC BRONCHITIS 08/30/2007  . ASTHMA 08/30/2007    Clarence Cogswell 09/24/2015, 2:25 PM [name], OTR/L [phone] Phone: [phone] 2:25 PM 2015/09/24 [facility] [address] [place], [place], [address] Phone: [phone]   Fax:  [phone]  Name: Craig Wilcox MRN: [id number] Date of Birth: Feb 08, 1956

## 2015-09-27 DEATH — deceased
